# Patient Record
Sex: Male | Born: 1980 | Race: White | Hispanic: No | Marital: Single | Smoking: Never smoker
Health system: Southern US, Community
[De-identification: ages and names within clinical notes are randomized; demographics above are authoritative.]

## PROBLEM LIST (undated history)

## (undated) ENCOUNTER — Emergency Department: Admission: EM | Discharge status: Absent without leave | Payer: Self-pay

## (undated) DIAGNOSIS — M722 Plantar fascial fibromatosis: Secondary | ICD-10-CM

## (undated) DIAGNOSIS — K5792 Diverticulitis of intestine, part unspecified, without perforation or abscess without bleeding: Secondary | ICD-10-CM

## (undated) DIAGNOSIS — F909 Attention-deficit hyperactivity disorder, unspecified type: Secondary | ICD-10-CM

## (undated) DIAGNOSIS — G4730 Sleep apnea, unspecified: Secondary | ICD-10-CM

## (undated) HISTORY — DX: Diverticulitis of intestine, part unspecified, without perforation or abscess without bleeding: K57.92

## (undated) HISTORY — DX: Morbid (severe) obesity due to excess calories: E66.01

## (undated) HISTORY — DX: Attention-deficit hyperactivity disorder, unspecified type: F90.9

---
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
## 2017-11-28 DIAGNOSIS — G4733 Obstructive sleep apnea (adult) (pediatric): Secondary | ICD-10-CM | POA: Diagnosis not present

## 2017-12-26 DIAGNOSIS — G4733 Obstructive sleep apnea (adult) (pediatric): Secondary | ICD-10-CM | POA: Diagnosis not present

## 2018-01-17 DIAGNOSIS — F902 Attention-deficit hyperactivity disorder, combined type: Secondary | ICD-10-CM | POA: Diagnosis not present

## 2018-01-17 DIAGNOSIS — F419 Anxiety disorder, unspecified: Secondary | ICD-10-CM | POA: Diagnosis not present

## 2018-01-17 DIAGNOSIS — Z79899 Other long term (current) drug therapy: Secondary | ICD-10-CM | POA: Diagnosis not present

## 2018-01-17 DIAGNOSIS — G4733 Obstructive sleep apnea (adult) (pediatric): Secondary | ICD-10-CM | POA: Diagnosis not present

## 2018-01-24 ENCOUNTER — Emergency Department
Admission: EM | Admit: 2018-01-24 | Discharge: 2018-01-24 | Disposition: A | Discharge status: Home / Self Care | Payer: BLUE CROSS/BLUE SHIELD | Source: Home / Self Care

## 2018-01-24 ENCOUNTER — Emergency Department (INDEPENDENT_AMBULATORY_CARE_PROVIDER_SITE_OTHER): Payer: BLUE CROSS/BLUE SHIELD

## 2018-01-24 ENCOUNTER — Other Ambulatory Visit: Payer: Self-pay

## 2018-01-24 DIAGNOSIS — M19071 Primary osteoarthritis, right ankle and foot: Secondary | ICD-10-CM

## 2018-01-24 DIAGNOSIS — K760 Fatty (change of) liver, not elsewhere classified: Secondary | ICD-10-CM

## 2018-01-24 DIAGNOSIS — M19072 Primary osteoarthritis, left ankle and foot: Secondary | ICD-10-CM

## 2018-01-24 DIAGNOSIS — R310 Gross hematuria: Secondary | ICD-10-CM | POA: Diagnosis not present

## 2018-01-24 DIAGNOSIS — K5732 Diverticulitis of large intestine without perforation or abscess without bleeding: Secondary | ICD-10-CM

## 2018-01-24 DIAGNOSIS — M79671 Pain in right foot: Secondary | ICD-10-CM | POA: Diagnosis not present

## 2018-01-24 DIAGNOSIS — M79672 Pain in left foot: Secondary | ICD-10-CM

## 2018-01-24 HISTORY — DX: Sleep apnea, unspecified: G47.30

## 2018-01-24 HISTORY — DX: Plantar fascial fibromatosis: M72.2

## 2018-01-24 LAB — POCT URINALYSIS DIP (MANUAL ENTRY)
Glucose, UA: NEGATIVE mg/dL
Leukocytes, UA: NEGATIVE
Nitrite, UA: NEGATIVE
SPEC GRAV UA: 1.025 (ref 1.010–1.025)
UROBILINOGEN UA: 1 U/dL
pH, UA: 6 (ref 5.0–8.0)

## 2018-01-24 LAB — POCT FASTING CBG KUC MANUAL ENTRY: POCT GLUCOSE (MANUAL ENTRY) KUC: 123 mg/dL — AB (ref 70–99)

## 2018-01-24 MED ORDER — METRONIDAZOLE 500 MG PO TABS: 500.0000 mg | ORAL_TABLET | Freq: Two times a day (BID) | ORAL | 0 refills | Status: DC

## 2018-01-24 MED ORDER — DICLOFENAC SODIUM 1 % TD GEL: 4.0000 g | Freq: Four times a day (QID) | TRANSDERMAL | 2 refills | Status: AC

## 2018-01-24 MED ORDER — CIPROFLOXACIN HCL 500 MG PO TABS: 500.0000 mg | ORAL_TABLET | Freq: Two times a day (BID) | ORAL | 0 refills | Status: DC

## 2018-01-24 NOTE — ED Triage Notes (Signed)
Pt has had foot pain since last June when he was dx with plantar fasciitis.  Last Sunday had lower abdominal pain on the left lower side.  Had sharp pain when going to the [facility]. This pain has mostly resolved.  Left big toe pain yesterday morning, then right foot started hurting later in the day.

## 2018-01-24 NOTE — ED Provider Notes (Signed)
[facility]    CSN: [id number] Arrival date & time: 01/24/18  0944     History   Chief Complaint Chief Complaint  Patient presents with  . Foot Pain    HPI Fernando Martinez is a 37 y.o. male.   Pt complains of pain in his left lower abdomen.  Pt reported some discomfort in his back.  Pt complains of soreness in left lower abdomen.  Pt complains of an aching pain.  Pt reports not as bad today as yesterday.  Pt has had some increased urination,  Pt has a family history of diabetes.  Pt denies fever or chills,  No black or tarry stools.    The history is provided by the patient.  Foot Pain  This is a chronic problem. Episode onset: months. Associated symptoms include abdominal pain. The symptoms are aggravated by walking. Nothing relieves the symptoms. He has tried nothing for the symptoms. The treatment provided no relief.   Pt has been diagnosed with plantar fascitis.  Pt complains of pain in hie right lateral foot.  Pt has recnely had pain in his right toe Past Medical History:  Diagnosis Date  . Plantar fasciitis   . Sleep apnea     There are no active problems to display for this patient.   History reviewed. No pertinent surgical history.     Home Medications    Prior to Admission medications   Medication Sig Start Date End Date Taking? Authorizing Provider  ciprofloxacin (CIPRO) 500 MG tablet Take 1 tablet (500 mg total) by mouth 2 (two) times daily. 01/24/18   Elson AreasSofia, Joren Rehm K, PA-C  diclofenac sodium (VOLTAREN) 1 % GEL Apply 4 g topically 4 (four) times daily. 01/24/18   Elson AreasSofia, Ambrose Wile K, PA-C  metroNIDAZOLE (FLAGYL) 500 MG tablet Take 1 tablet (500 mg total) by mouth 2 (two) times daily. 01/24/18   Elson AreasSofia, Kristle Wesch K, PA-C  VYVANSE 30 MG capsule Take by mouth daily. 01/17/18   [provider]  VYVANSE 40 MG capsule Take by mouth daily. 01/17/18   [provider]    Family History Family History  Problem Relation Age of Onset  . Diabetes  Mother   . ADD / ADHD Mother   . Thyroid disease Father   . ADD / ADHD Sister     Social History Social History   Tobacco Use  . Smoking status: Never Smoker  . Smokeless tobacco: Never Used  Substance Use Topics  . Alcohol use: Yes  . Drug use: Not Currently     Allergies   Patient has no known allergies.   Review of Systems Review of Systems  Gastrointestinal: Positive for abdominal pain. Negative for constipation, diarrhea, nausea and vomiting.  Genitourinary: Positive for frequency.  Musculoskeletal: Positive for arthralgias and gait problem.  All other systems reviewed and are negative.    Physical Exam Triage Vital Signs ED Triage Vitals  Enc Vitals Group     BP 01/24/18 1019 136/85     Pulse Rate 01/24/18 1019 79     Resp --      Temp 01/24/18 1019 (!) 97.5 F (36.4 C)     Temp Source 01/24/18 1019 Oral     SpO2 01/24/18 1019 98 %     Weight 01/24/18 1020 (!) 330 lb (149.7 kg)     Height 01/24/18 1020 5' 7.5" (1.715 m)     Head Circumference --      Peak Flow --  Pain Score 01/24/18 1020 7     Pain Loc --      Pain Edu? --      Excl. in GC? --    No data found.  Updated Vital Signs BP 136/85 (BP Location: Right Arm)   Pulse 79   Temp (!) 97.5 F (36.4 C) (Oral)   Ht 5' 7.5" (1.715 m)   Wt (!) 330 lb (149.7 kg)   SpO2 98%   BMI 50.92 kg/m   Visual Acuity Right Eye Distance:   Left Eye Distance:   Bilateral Distance:    Right Eye Near:   Left Eye Near:    Bilateral Near:     Physical Exam  Constitutional: He appears well-developed and well-nourished.  HENT:  Head: Normocephalic and atraumatic.  Mouth/Throat: Oropharynx is clear and moist.  Eyes: Conjunctivae are normal.  Neck: Neck supple.  Cardiovascular: Normal rate and regular rhythm.  No murmur heard. Pulmonary/Chest: Effort normal and breath sounds normal. No respiratory distress.  Abdominal: Soft. There is tenderness.  Tender left lower abdomen   Musculoskeletal: He  exhibits tenderness. He exhibits no edema or deformity.  Tender right lateral foot.  Neurological: He is alert.  Skin: Skin is warm and dry.  Psychiatric: He has a normal mood and affect.  Nursing note and vitals reviewed.    UC Treatments / Results  Labs (all labs ordered are listed, but only abnormal results are displayed) Labs Reviewed  POCT FASTING CBG [facility] MANUAL ENTRY - Abnormal; Notable for the following components:      Result Value   POCT Glucose ([facility]) 123 (*)    All other components within normal limits  POCT URINALYSIS DIP (MANUAL ENTRY) - Abnormal; Notable for the following components:   Bilirubin, UA small (*)    Ketones, POC UA trace (5) (*)    Blood, UA moderate (*)    Protein Ur, POC =30 (*)    All other components within normal limits    EKG None Radiology Dg Foot Complete Left  Result Date: 01/24/2018 CLINICAL DATA:  Chronic bilateral foot pain.  No known injury. EXAM: LEFT FOOT - COMPLETE 3+ VIEW; RIGHT FOOT COMPLETE - 3+ VIEW COMPARISON:  None. FINDINGS: Left foot: No acute fracture or dislocation. Mild osteoarthritis of the first MTP joint. Remaining joint spaces are preserved. Bone mineralization is normal. Tiny plantar and Achilles enthesophytes. Soft tissues are unremarkable. Right foot: No acute fracture or dislocation. Mild osteoarthritis of the first MTP joint. Remaining joint spaces are preserved. Bone mineralization is normal. Soft tissues are unremarkable. IMPRESSION: 1.  No acute osseous abnormality. 2. Bilateral mild first MTP joint osteoarthritis. Electronically Signed   By: Obie Dredge M.D.   On: 01/24/2018 11:07   Dg Foot Complete Right  Result Date: 01/24/2018 CLINICAL DATA:  Chronic bilateral foot pain.  No known injury. EXAM: LEFT FOOT - COMPLETE 3+ VIEW; RIGHT FOOT COMPLETE - 3+ VIEW COMPARISON:  None. FINDINGS: Left foot: No acute fracture or dislocation. Mild osteoarthritis of the first MTP joint. Remaining joint spaces are preserved. Bone  mineralization is normal. Tiny plantar and Achilles enthesophytes. Soft tissues are unremarkable. Right foot: No acute fracture or dislocation. Mild osteoarthritis of the first MTP joint. Remaining joint spaces are preserved. Bone mineralization is normal. Soft tissues are unremarkable. IMPRESSION: 1.  No acute osseous abnormality. 2. Bilateral mild first MTP joint osteoarthritis. Electronically Signed   By: Obie Dredge M.D.   On: 01/24/2018 11:07   Ct Renal Stone Study  Result Date: 01/24/2018 CLINICAL DATA:  Acute left lower quadrant abdominal pain, gross hematuria. EXAM: CT ABDOMEN AND PELVIS WITHOUT CONTRAST TECHNIQUE: Multidetector CT imaging of the abdomen and pelvis was performed following the standard protocol without IV contrast. COMPARISON:  None. FINDINGS: Lower chest: No acute abnormality. Hepatobiliary: No gallstones are noted. Fatty infiltration of the liver is noted. No biliary dilatation is noted. Pancreas: Unremarkable. No pancreatic ductal dilatation or surrounding inflammatory changes. Spleen: Normal in size without focal abnormality. Adrenals/Urinary Tract: Adrenal glands are unremarkable. Kidneys are normal, without renal calculi, focal lesion, or hydronephrosis. Bladder is unremarkable. Stomach/Bowel: The stomach appears normal. The appendix appears normal. There is no evidence of bowel obstruction. Focal diverticulitis is noted at the junction of the descending and sigmoid colon. No abscess is noted. Vascular/Lymphatic: No significant vascular findings are present. No enlarged abdominal or pelvic lymph nodes. Reproductive: Prostate is unremarkable. Other: No abdominal wall hernia or abnormality. No abdominopelvic ascites. Musculoskeletal: No acute or significant osseous findings. IMPRESSION: Focal diverticulitis noted at the junction of the descending and sigmoid colon. No abscess or evidence of perforation is noted. Fatty infiltration of the liver. Electronically Signed   By: Lupita Raider, M.D.   On: 01/24/2018 11:57    Procedures Procedures (including critical care time)  Medications Ordered in UC Medications - No data to display   Initial Impression / Assessment and Plan / UC Course  I have reviewed the triage vital signs and the nursing notes.  Pertinent labs & imaging results that were available during my care of the patient were reviewed by me and considered in my medical decision making (see chart for details).     Ua shows blood and protein.  No nitrate or leukocytes.  Pt advised his MD will need to repeat ua for blood.  Ct scan shows no evidence of stone.  Pt has elevated glucose of 123.  (this is fasting) Cmet pending.  Pt advised discuss with his primary.   Xrays of feet show arthritis.  Pt advised weight loss, voltaren gel, good shoes,  Schedule to see Dr. Denyse Amass for Orthotics  Ct scan of pt abdomen is reviewed and discussed with him.  He has diverticulitis.  Pt given rx for cipro and flagyl.  He is advised to see primary for recheck and Gi referral for follow up.   Final Clinical Impressions(s) / UC Diagnoses   Final diagnoses:  Foot pain, right  Foot pain, left  Diverticulitis of colon    ED Discharge Orders        Ordered    metroNIDAZOLE (FLAGYL) 500 MG tablet  2 times daily     01/24/18 1223    ciprofloxacin (CIPRO) 500 MG tablet  2 times daily     01/24/18 1223    diclofenac sodium (VOLTAREN) 1 % GEL  4 times daily     01/24/18 1223       Controlled Substance Prescriptions [place] Controlled Substance Registry consulted? Not Applicable   An After Visit Summary was printed and given to the patient.   Elson Areas, [place] 01/24/18 1341

## 2018-01-24 NOTE — Discharge Instructions (Signed)
See your Physician for recheck abdominal pain.

## 2018-01-25 ENCOUNTER — Encounter: Payer: Self-pay | Admitting: Family Medicine

## 2018-01-25 ENCOUNTER — Ambulatory Visit: Payer: BLUE CROSS/BLUE SHIELD | Admitting: Family Medicine

## 2018-01-25 VITALS — BP 124/84 | HR 98 | Ht 67.52 in | Wt 326.0 lb

## 2018-01-25 DIAGNOSIS — M10071 Idiopathic gout, right ankle and foot: Secondary | ICD-10-CM

## 2018-01-25 DIAGNOSIS — F909 Attention-deficit hyperactivity disorder, unspecified type: Secondary | ICD-10-CM

## 2018-01-25 DIAGNOSIS — M109 Gout, unspecified: Secondary | ICD-10-CM

## 2018-01-25 DIAGNOSIS — M2021 Hallux rigidus, right foot: Secondary | ICD-10-CM

## 2018-01-25 DIAGNOSIS — M2022 Hallux rigidus, left foot: Secondary | ICD-10-CM

## 2018-01-25 DIAGNOSIS — M722 Plantar fascial fibromatosis: Secondary | ICD-10-CM

## 2018-01-25 DIAGNOSIS — K5792 Diverticulitis of intestine, part unspecified, without perforation or abscess without bleeding: Secondary | ICD-10-CM | POA: Diagnosis not present

## 2018-01-25 HISTORY — DX: Morbid (severe) obesity due to excess calories: E66.01

## 2018-01-25 HISTORY — DX: Diverticulitis of intestine, part unspecified, without perforation or abscess without bleeding: K57.92

## 2018-01-25 HISTORY — DX: Attention-deficit hyperactivity disorder, unspecified type: F90.9

## 2018-01-25 LAB — CBC
HEMATOCRIT: 41.9 % (ref 38.5–50.0)
Hemoglobin: 14.1 g/dL (ref 13.2–17.1)
MCH: 27.4 pg (ref 27.0–33.0)
MCHC: 33.7 g/dL (ref 32.0–36.0)
MCV: 81.4 fL (ref 80.0–100.0)
MPV: 9.3 fL (ref 7.5–12.5)
PLATELETS: 389 10*3/uL (ref 140–400)
RBC: 5.15 10*6/uL (ref 4.20–5.80)
RDW: 12.5 % (ref 11.0–15.0)
WBC: 15.6 10*3/uL — AB (ref 3.8–10.8)

## 2018-01-25 MED ORDER — HYDROCODONE-ACETAMINOPHEN 5-325 MG PO TABS: 1.0000 | ORAL_TABLET | Freq: Four times a day (QID) | ORAL | 0 refills | Status: DC | PRN

## 2018-01-25 MED ORDER — COLCHICINE 0.6 MG PO TABS: 0.6000 mg | ORAL_TABLET | Freq: Every day | ORAL | 3 refills | Status: AC

## 2018-01-25 NOTE — Patient Instructions (Signed)
Thank you for coming in today. Use the cam walker boot as needed.  Do not drive with the boot on your right foot. It is ok to take it off for driving.  Take colchicine 1 pill 2x daily today and 1 pill daily for 1 week. We will extend the colchicine if you uric acid levels are high and we are using Uloric or Allopurinol to lower the levels.  Use norco sparingly for severe pain.  Do not drive with this medicine.  Get blood work today.  I will get results to you likely tomorrow.  Recheck with me in 2 weeks.  Return or call sooner if needed.    Gout Gout is painful swelling that can occur in some of your joints. Gout is a type of arthritis. This condition is caused by having too much uric acid in your body. Uric acid is a chemical that forms when your body breaks down substances called purines. Purines are important for building body proteins. When your body has too much uric acid, sharp crystals can form and build up inside your joints. This causes pain and swelling. Gout attacks can happen quickly and be very painful (acute gout). Over time, the attacks can affect more joints and become more frequent (chronic gout). Gout can also cause uric acid to build up under your skin and inside your kidneys. What are the causes? This condition is caused by too much uric acid in your blood. This can occur because:  Your kidneys do not remove enough uric acid from your blood. This is the most common cause.  Your body makes too much uric acid. This can occur with some cancers and cancer treatments. It can also occur if your body is breaking down too many red blood cells (hemolytic anemia).  You eat too many foods that are high in purines. These foods include organ meats and some seafood. Alcohol, especially beer, is also high in purines.  A gout attack may be triggered by trauma or stress. What increases the risk? This condition is more likely to develop in people who:  Have a family history of  gout.  Are male and middle-aged.  Are male and have gone through menopause.  Are obese.  Frequently drink alcohol, especially beer.  Are dehydrated.  Lose weight too quickly.  Have an organ transplant.  Have lead poisoning.  Take certain medicines, including aspirin, cyclosporine, diuretics, levodopa, and niacin.  Have kidney disease or psoriasis.  What are the signs or symptoms? An attack of acute gout happens quickly. It usually occurs in just one joint. The most common place is the big toe. Attacks often start at night. Other joints that may be affected include joints of the feet, ankle, knee, fingers, wrist, or elbow. Symptoms may include:  Severe pain.  Warmth.  Swelling.  Stiffness.  Tenderness. The affected joint may be very painful to touch.  Shiny, red, or purple skin.  Chills and fever.  Chronic gout may cause symptoms more frequently. More joints may be involved. You may also have white or yellow lumps (tophi) on your hands or feet or in other areas near your joints. How is this diagnosed? This condition is diagnosed based on your symptoms, medical history, and physical exam. You may have tests, such as:  Blood tests to measure uric acid levels.  Removal of joint fluid with a needle (aspiration) to look for uric acid crystals.  X-rays to look for joint damage.  How is this treated? Treatment for  this condition has two phases: treating an acute attack and preventing future attacks. Acute gout treatment may include medicines to reduce pain and swelling, including:  NSAIDs.  Steroids. These are strong anti-inflammatory medicines that can be taken by mouth (orally) or injected into a joint.  Colchicine. This medicine relieves pain and swelling when it is taken soon after an attack. It can be given orally or through an IV tube.  Preventive treatment may include:  Daily use of smaller doses of NSAIDs or colchicine.  Use of a medicine that reduces  uric acid levels in your blood.  Changes to your diet. You may need to see a specialist about healthy eating (dietitian).  Follow these instructions at home: During a Gout Attack  If directed, apply ice to the affected area: ? Put ice in a plastic bag. ? Place a towel between your skin and the bag. ? Leave the ice on for 20 minutes, 2-3 times a day.  Rest the joint as much as possible. If the affected joint is in your leg, you may be given crutches to use.  Raise (elevate) the affected joint above the level of your heart as often as possible.  Drink enough fluids to keep your urine clear or pale yellow.  Take over-the-counter and prescription medicines only as told by your health care provider.  Do not drive or operate heavy machinery while taking prescription pain medicine.  Follow instructions from your health care provider about eating or drinking restrictions.  Return to your normal activities as told by your health care provider. Ask your health care provider what activities are safe for you. Avoiding Future Gout Attacks  Follow a low-purine diet as told by your dietitian or health care provider. Avoid foods and drinks that are high in purines, including liver, kidney, anchovies, asparagus, herring, mushrooms, mussels, and beer.  Limit alcohol intake to no more than 1 drink a day for nonpregnant women and 2 drinks a day for men. One drink equals 12 oz of beer, 5 oz of wine, or 1 oz of hard liquor.  Maintain a healthy weight or lose weight if you are overweight. If you want to lose weight, talk with your health care provider. It is important that you do not lose weight too quickly.  Start or maintain an exercise program as told by your health care provider.  Drink enough fluids to keep your urine clear or pale yellow.  Take over-the-counter and prescription medicines only as told by your health care provider.  Keep all follow-up visits as told by your health care provider.  This is important. Contact a health care provider if:  You have another gout attack.  You continue to have symptoms of a gout attack after10 days of treatment.  You have side effects from your medicines.  You have chills or a fever.  You have burning pain when you urinate.  You have pain in your lower back or belly. Get help right away if:  You have severe or uncontrolled pain.  You cannot urinate. This information is not intended to replace advice given to you by your health care provider. Make sure you discuss any questions you have with your health care provider. Document Released: 09/17/2000 Document Revised: 02/26/2016 Document Reviewed: 07/03/2015 [employer] Interactive Patient Education  [employer]2018 [employer]

## 2018-01-25 NOTE — Progress Notes (Signed)
Subjective:    I'm seeing this patient as a consultation for:  Langston Masker PA-C  CC: Right Foot Pain  HPI: Fernando Martinez was seen yesterday in urgent care for worsening right foot pain.  He has had pains off and on in both feet for years but notes over the last few days the pain is been worsening dramatically in his right foot.  He notes pain in his plantar calcaneus and across his dorsal midfoot but most predominantly around his right first MTP.  He notes pain and swelling.  He denies any injury.  He has never had an evaluation for gout nor been treated for gout.  In urgent care he was also having what was thought to be a diverticulitis flare which she is currently receiving Cipro and Flagyl.  Therefore NSAIDs or steroids were not used for foot pain.  He notes the pain is quite severe and he has difficulty walking.  He denies any radiating pain weakness or numbness fevers or chills.  He does have some pain in the left foot across the plantar calcaneus but notes the right is much worse than the left.  Past medical history, Surgical history, Family history not pertinant except as noted below, Social history, Allergies, and medications have been entered into the medical record, reviewed, and no changes needed.   Review of Systems: No headache, visual changes, nausea, vomiting, diarrhea, constipation, dizziness, abdominal pain, skin rash, fevers, chills, night sweats, weight loss, swollen lymph nodes, body aches, joint swelling, muscle aches, chest pain, shortness of breath, mood changes, visual or auditory hallucinations.   Objective:    Vitals:   01/25/18 0733  BP: 124/84  Pulse: 98  SpO2: 97%   General: Well Developed, well nourished, and in no acute distress.  Neuro/Psych: Alert and oriented x3, extra-ocular muscles intact, able to move all 4 extremities, sensation grossly intact. Skin: Warm and dry, no rashes noted.  Respiratory: Not using accessory muscles, speaking in full sentences, trachea  midline.  Cardiovascular: Pulses palpable, no extremity edema. Abdomen: Does not appear distended. MSK:  Right foot swollen and mild erythema at first MTP.  Otherwise normal-appearing with no induration or ecchymosis. Mildly tender to palpation plantar calcaneus Mildly tender to palpation across the plantar dorsal midfoot Significantly tender to palpation at the first MTP with decreased motion. Pulses capillary refill and sensation are intact  Left foot: Largely normal-appearing Tender palpation plantar calcaneus. Nontender otherwise. Decreased motion left first MTP   Results for orders placed or performed during the hospital encounter of 01/24/18 (from the past 24 hour(s))  POCT Finger stick Glucose     Status: Abnormal   Collection Time: 01/24/18 10:36 AM  Result Value Ref Range   POCT Glucose ([facility]) 123 (A) 70 - 99 mg/dL  POCT urinalysis dipstick (new)     Status: Abnormal   Collection Time: 01/24/18 10:44 AM  Result Value Ref Range   Color, UA yellow yellow   Clarity, UA clear clear   Glucose, UA negative negative mg/dL   Bilirubin, UA small (A) negative   Ketones, POC UA trace (5) (A) negative mg/dL   Spec Grav, UA 1.610 9.604 - 1.025   Blood, UA moderate (A) negative   pH, UA 6.0 5.0 - 8.0   Protein Ur, POC =30 (A) negative mg/dL   Urobilinogen, UA 1.0 0.2 or 1.0 E.U./dL   Nitrite, UA Negative Negative   Leukocytes, UA Negative Negative   Dg Foot Complete Left  Result Date: 01/24/2018 CLINICAL DATA:  Chronic bilateral foot pain.  No known injury. EXAM: LEFT FOOT - COMPLETE 3+ VIEW; RIGHT FOOT COMPLETE - 3+ VIEW COMPARISON:  None. FINDINGS: Left foot: No acute fracture or dislocation. Mild osteoarthritis of the first MTP joint. Remaining joint spaces are preserved. Bone mineralization is normal. Tiny plantar and Achilles enthesophytes. Soft tissues are unremarkable. Right foot: No acute fracture or dislocation. Mild osteoarthritis of the first MTP joint. Remaining joint  spaces are preserved. Bone mineralization is normal. Soft tissues are unremarkable. IMPRESSION: 1.  No acute osseous abnormality. 2. Bilateral mild first MTP joint osteoarthritis. Electronically Signed   By: Obie Dredge M.D.   On: 01/24/2018 11:07   Dg Foot Complete Right  Result Date: 01/24/2018 CLINICAL DATA:  Chronic bilateral foot pain.  No known injury. EXAM: LEFT FOOT - COMPLETE 3+ VIEW; RIGHT FOOT COMPLETE - 3+ VIEW COMPARISON:  None. FINDINGS: Left foot: No acute fracture or dislocation. Mild osteoarthritis of the first MTP joint. Remaining joint spaces are preserved. Bone mineralization is normal. Tiny plantar and Achilles enthesophytes. Soft tissues are unremarkable. Right foot: No acute fracture or dislocation. Mild osteoarthritis of the first MTP joint. Remaining joint spaces are preserved. Bone mineralization is normal. Soft tissues are unremarkable. IMPRESSION: 1.  No acute osseous abnormality. 2. Bilateral mild first MTP joint osteoarthritis. Electronically Signed   By: Obie Dredge M.D.   On: 01/24/2018 11:07   Ct Renal Stone Study  Result Date: 01/24/2018 CLINICAL DATA:  Acute left lower quadrant abdominal pain, gross hematuria. EXAM: CT ABDOMEN AND PELVIS WITHOUT CONTRAST TECHNIQUE: Multidetector CT imaging of the abdomen and pelvis was performed following the standard protocol without IV contrast. COMPARISON:  None. FINDINGS: Lower chest: No acute abnormality. Hepatobiliary: No gallstones are noted. Fatty infiltration of the liver is noted. No biliary dilatation is noted. Pancreas: Unremarkable. No pancreatic ductal dilatation or surrounding inflammatory changes. Spleen: Normal in size without focal abnormality. Adrenals/Urinary Tract: Adrenal glands are unremarkable. Kidneys are normal, without renal calculi, focal lesion, or hydronephrosis. Bladder is unremarkable. Stomach/Bowel: The stomach appears normal. The appendix appears normal. There is no evidence of bowel obstruction.  Focal diverticulitis is noted at the junction of the descending and sigmoid colon. No abscess is noted. Vascular/Lymphatic: No significant vascular findings are present. No enlarged abdominal or pelvic lymph nodes. Reproductive: Prostate is unremarkable. Other: No abdominal wall hernia or abnormality. No abdominopelvic ascites. Musculoskeletal: No acute or significant osseous findings. IMPRESSION: Focal diverticulitis noted at the junction of the descending and sigmoid colon. No abscess or evidence of perforation is noted. Fatty infiltration of the liver. Electronically Signed   By: Lupita Raider, M.D.   On: 01/24/2018 11:57  X-ray images of the feet were reviewed independently  Impression and Recommendations:    Assessment and Plan: 37 y.o. male with  Right foot pain: Multifactorial likely some component of plantar fasciitis and first MTP DJD.  However very likely the majority of the pain is due to gout.  Plan for empiric treatment with Cam walker boot and crutches for comfort, and colchicine.  Will check CBC metabolic panel and uric acid.  Likely will start Uloric or allopurinol for uric acid lowering tomorrow when results are back.  Norco for severe pain as needed.  Recheck in 2 weeks.  Ultimate plan is to get presumed gout stabilized and transition care back to PCP.  Will continue treatment for plantar fasciitis once gout flare is under control.  Follow-up with PCP regarding diverticulitis. Additionally recommend follow-up with PCP to address  morbid obesity.  BMI 50 today.  Orders Placed This Encounter  Procedures  . CBC  . COMPLETE METABOLIC PANEL WITH GFR  . Uric acid   Meds ordered this encounter  Medications  . colchicine 0.6 MG tablet    Sig: Take 1 tablet (0.6 mg total) by mouth daily.    Dispense:  30 tablet    Refill:  3  . HYDROcodone-acetaminophen (NORCO/VICODIN) 5-325 MG tablet    Sig: Take 1 tablet by mouth every 6 (six) hours as needed.    Dispense:  10 tablet    Refill:   0    Discussed warning signs or symptoms. Please see discharge instructions. Patient expresses understanding.   CC: Farris HasMorrow, Aaron, MD

## 2018-01-26 DIAGNOSIS — G4733 Obstructive sleep apnea (adult) (pediatric): Secondary | ICD-10-CM | POA: Diagnosis not present

## 2018-01-26 DIAGNOSIS — M109 Gout, unspecified: Secondary | ICD-10-CM | POA: Insufficient documentation

## 2018-01-26 LAB — COMPLETE METABOLIC PANEL WITH GFR
AG Ratio: 1.4 (calc) (ref 1.0–2.5)
ALT: 33 U/L (ref 9–46)
AST: 25 U/L (ref 10–40)
Albumin: 4.2 g/dL (ref 3.6–5.1)
Alkaline phosphatase (APISO): 75 U/L (ref 40–115)
BILIRUBIN TOTAL: 1 mg/dL (ref 0.2–1.2)
BUN: 10 mg/dL (ref 7–25)
CO2: 24 mmol/L (ref 20–32)
Calcium: 9.3 mg/dL (ref 8.6–10.3)
Chloride: 98 mmol/L (ref 98–110)
Creat: 0.98 mg/dL (ref 0.60–1.35)
GFR, Est African American: 115 mL/min/{1.73_m2} (ref >=60)
GFR, Est Non African American: 99 mL/min/{1.73_m2} (ref >=60)
GLUCOSE: 106 mg/dL — AB (ref 65–99)
Globulin: 3 g/dL (calc) (ref 1.9–3.7)
POTASSIUM: 4 mmol/L (ref 3.5–5.3)
Sodium: 135 mmol/L (ref 135–146)
TOTAL PROTEIN: 7.2 g/dL (ref 6.1–8.1)

## 2018-01-26 LAB — URIC ACID: Uric Acid, Serum: 8.9 mg/dL — ABNORMAL HIGH (ref 4.0–8.0)

## 2018-01-26 MED ORDER — FEBUXOSTAT 80 MG PO TABS: 1.0000 | ORAL_TABLET | Freq: Every day | ORAL | 1 refills | Status: AC

## 2018-01-26 NOTE — Addendum Note (Signed)
Addended by: Rodolph BongOREY, Waverley Krempasky S on: 01/26/2018 07:38 AM   Modules accepted: Orders

## 2018-01-27 ENCOUNTER — Telehealth: Payer: Self-pay | Admitting: Emergency Medicine

## 2018-01-27 NOTE — Telephone Encounter (Signed)
Dr. Denyse Amassorey present to clarify how to take meds for current gout flare; he will be following up in office soon; already reports some relief of pain.

## 2018-02-06 DIAGNOSIS — R319 Hematuria, unspecified: Secondary | ICD-10-CM | POA: Diagnosis not present

## 2018-02-06 DIAGNOSIS — R7309 Other abnormal glucose: Secondary | ICD-10-CM | POA: Diagnosis not present

## 2018-02-06 DIAGNOSIS — K5792 Diverticulitis of intestine, part unspecified, without perforation or abscess without bleeding: Secondary | ICD-10-CM | POA: Diagnosis not present

## 2018-02-09 ENCOUNTER — Ambulatory Visit: Payer: BLUE CROSS/BLUE SHIELD | Admitting: Family Medicine

## 2018-02-09 ENCOUNTER — Encounter: Payer: Self-pay | Admitting: Family Medicine

## 2018-02-09 VITALS — BP 169/83 | HR 90 | Ht 67.0 in | Wt 317.0 lb

## 2018-02-09 DIAGNOSIS — M79671 Pain in right foot: Secondary | ICD-10-CM | POA: Insufficient documentation

## 2018-02-09 DIAGNOSIS — M2021 Hallux rigidus, right foot: Secondary | ICD-10-CM | POA: Diagnosis not present

## 2018-02-09 DIAGNOSIS — M2022 Hallux rigidus, left foot: Secondary | ICD-10-CM

## 2018-02-09 DIAGNOSIS — M10071 Idiopathic gout, right ankle and foot: Secondary | ICD-10-CM

## 2018-02-09 DIAGNOSIS — R030 Elevated blood-pressure reading, without diagnosis of hypertension: Secondary | ICD-10-CM | POA: Diagnosis not present

## 2018-02-09 NOTE — Patient Instructions (Addendum)
Continue colchicine daily.  Continue Uloric daily.  After 1-3 months stop colchicine. If pain returns resume colchicine.  You should have uric acid level recheck in 3-6 months.  Weight loss will help with gout.   Recheck with me as needed.  Ultimately your primary doctor can take over gout prescribing.   If foot pain do not resolve recheck in a few weeks. Next step is MRI and orthotics or injection.   Recheck as needed.

## 2018-02-09 NOTE — Progress Notes (Signed)
Fernando Martinez is a 37 y.o. male who presents to [facility] today for follow-up foot pain from gout.  Fernando Martinez was seen on April 24 for significant pain in his great toe.  X-rays did show some DJD however he was highly suspicious for gout.  He was treated empirically with colchicine and his uric acid level was checked and found to be elevated at 8.9.  He was asked to continue the colchicine and Uloric was prescribed as well.  He was given a Cam walker boot and Norco as needed.  In the interim his pain Martinez significantly improved.  He notes that he was able to wean out of the boot and stop Norco.  He forgot to take the colchicine for a few days and developed new right lateral foot pain.  He points to the lateral midfoot as his area of maximum pain.  He notes this is mild to moderately painful with walking.  He continues to use normal shoes.  He notes his activity level Martinez increased recently at work.  Data blood pressure: Patient notes that his blood pressure is typically been pretty well controlled.  He was seen by his primary care provider recently with a normal blood pressure measurement.   Past Medical History:  Diagnosis Date  . ADHD 01/25/2018  . Diverticulitis 01/25/2018  . Morbid obesity (HCC) 01/25/2018  . Plantar fasciitis   . Sleep apnea    No past surgical history on file. Social History   Tobacco Use  . Smoking status: Never Smoker  . Smokeless tobacco: Never Used  Substance Use Topics  . Alcohol use: Yes     ROS:  As above   Medications: Current Outpatient Medications  Medication Sig Dispense Refill  . colchicine 0.6 MG tablet Take 1 tablet (0.6 mg total) by mouth daily. 30 tablet 3  . diclofenac sodium (VOLTAREN) 1 % GEL Apply 4 g topically 4 (four) times daily. 1 Tube 2  . Febuxostat (ULORIC) 80 MG TABS Take 1 tablet (80 mg total) by mouth daily. 90 tablet 1  . VYVANSE 30 MG capsule Take by mouth daily.  0  . VYVANSE 40 MG  capsule Take by mouth daily.  0   No current facility-administered medications for this visit.    No Known Allergies   Exam:  BP (!) 169/83   Pulse 90   Ht  (1.702 m)   Wt (!) 317 lb (143.8 kg)   BMI 49.65 kg/m   General: Well Developed, well nourished, and in no acute distress.  Neuro/Psych: Alert and oriented x3, extra-ocular muscles intact, able to move all 4 extremities, sensation grossly intact. Skin: Warm and dry, no rashes noted.  Respiratory: Not using accessory muscles, speaking in full sentences, trachea midline.  Cardiovascular: Pulses palpable, no extremity edema. Abdomen: Does not appear distended. MSK:  MSK:  Right foot mild swelling across the dorsal midfoot no significant swelling or tenderness at first MTP.  Bunion formation present however.  Otherwise normal-appearing with no induration or ecchymosis. Tender to palpation at dorsal lateral midfoot near the cuboid bone. Ankle is nontender.  Foot and ankle motion are normal. Pulses capillary refill and sensation are intact  EXAM: LEFT FOOT - COMPLETE 3+ VIEW; RIGHT FOOT COMPLETE - 3+ VIEW  COMPARISON:  None.  FINDINGS: Left foot: No acute fracture or dislocation. Mild osteoarthritis of the first MTP joint. Remaining joint spaces are preserved. Bone mineralization is normal. Tiny plantar and Achilles enthesophytes. Soft tissues  are unremarkable.  Right foot: No acute fracture or dislocation. Mild osteoarthritis of the first MTP joint. Remaining joint spaces are preserved. Bone mineralization is normal. Soft tissues are unremarkable.  IMPRESSION: 1.  No acute osseous abnormality. 2. Bilateral mild first MTP joint osteoarthritis.   Electronically Signed   By: Obie Dredge M.D.   On: 01/24/2018 11:07 I personally (independently) visualized and performed the interpretation of the images attached in this note.  Lab Results  Component Value Date   LABURIC 8.9 (H) 01/25/2018     Chemistry       Component Value Date/Time   NA 135 01/25/2018 0857   K 4.0 01/25/2018 0857   CL 98 01/25/2018 0857   CO2 24 01/25/2018 0857   BUN 10 01/25/2018 0857   CREATININE 0.98 01/25/2018 0857      Component Value Date/Time   CALCIUM 9.3 01/25/2018 0857   AST 25 01/25/2018 0857   ALT 33 01/25/2018 0857   BILITOT 1.0 01/25/2018 0857     Lab Results  Component Value Date   WBC 15.6 (H) 01/25/2018   HGB 14.1 01/25/2018   HCT 41.9 01/25/2018   MCV 81.4 01/25/2018   PLT 389 01/25/2018     Assessment and Plan: 37 y.o. male with lateral midfoot pain.  Patient did not have much pain in this area on his initial presentation on April 24.  He was completely asymptomatic until recently.  His pain returned when he stopped taking his colchicine.  I suspect his knee pain is very likely related to gout.  Plan to resume colchicine daily.  Plan to continue Uloric as well.  Plan to continue colchicine prophylaxis for 1-3 months while getting initiated on Uloric.  His uric acid should be rechecked in about 3 to 6 months.  I am happy to do it or his primary care provider can certainly do it as well.  Ideally will be shifting gout management to PCP in the future.  As for today's foot pain I think it is gout as stated above.  We will try to restart colchicine and if pain does not improve will return to clinic for further evaluation.  Next step would likely be MRI of the foot to evaluate for stress injury or tendinitis or joint effusion.  Injection versus further immobilization versus custom orthotics certainly reasonable. Gout is chronic problem with exacerbation. Lateral foot pain is new problem with uncertain prognosis.   Elevated blood pressure: This seems to be an outlier.  Patient typically Martinez pretty good blood pressure control.  Follow-up with PCP in the future.   Recheck as needed.   Discussed warning signs or symptoms. Please see discharge instructions. Patient expresses understanding.  CC:  Fernando Has, MD [facility] [address] [place], [address] [place], [place] [address] Call: [phone]

## 2018-03-09 DIAGNOSIS — R194 Change in bowel habit: Secondary | ICD-10-CM | POA: Diagnosis not present

## 2018-03-09 DIAGNOSIS — K921 Melena: Secondary | ICD-10-CM | POA: Diagnosis not present

## 2018-03-09 DIAGNOSIS — K5792 Diverticulitis of intestine, part unspecified, without perforation or abscess without bleeding: Secondary | ICD-10-CM | POA: Diagnosis not present

## 2018-03-14 DIAGNOSIS — F902 Attention-deficit hyperactivity disorder, combined type: Secondary | ICD-10-CM | POA: Diagnosis not present

## 2018-03-14 DIAGNOSIS — Z79899 Other long term (current) drug therapy: Secondary | ICD-10-CM | POA: Diagnosis not present

## 2018-03-14 DIAGNOSIS — F419 Anxiety disorder, unspecified: Secondary | ICD-10-CM | POA: Diagnosis not present

## 2018-03-14 DIAGNOSIS — G4733 Obstructive sleep apnea (adult) (pediatric): Secondary | ICD-10-CM | POA: Diagnosis not present

## 2018-04-01 ENCOUNTER — Other Ambulatory Visit: Payer: Self-pay | Admitting: Family Medicine

## 2018-04-24 DIAGNOSIS — D126 Benign neoplasm of colon, unspecified: Secondary | ICD-10-CM | POA: Diagnosis not present

## 2018-04-24 DIAGNOSIS — R194 Change in bowel habit: Secondary | ICD-10-CM | POA: Diagnosis not present

## 2018-04-24 DIAGNOSIS — R933 Abnormal findings on diagnostic imaging of other parts of digestive tract: Secondary | ICD-10-CM | POA: Diagnosis not present

## 2018-04-24 DIAGNOSIS — K921 Melena: Secondary | ICD-10-CM | POA: Diagnosis not present

## 2018-04-24 DIAGNOSIS — K5730 Diverticulosis of large intestine without perforation or abscess without bleeding: Secondary | ICD-10-CM | POA: Diagnosis not present

## 2018-04-24 DIAGNOSIS — K648 Other hemorrhoids: Secondary | ICD-10-CM | POA: Diagnosis not present

## 2018-04-26 DIAGNOSIS — D126 Benign neoplasm of colon, unspecified: Secondary | ICD-10-CM | POA: Diagnosis not present

## 2018-06-07 DIAGNOSIS — F902 Attention-deficit hyperactivity disorder, combined type: Secondary | ICD-10-CM | POA: Diagnosis not present

## 2018-06-07 DIAGNOSIS — Z79899 Other long term (current) drug therapy: Secondary | ICD-10-CM | POA: Diagnosis not present

## 2018-07-19 DIAGNOSIS — G4733 Obstructive sleep apnea (adult) (pediatric): Secondary | ICD-10-CM | POA: Diagnosis not present

## 2018-09-08 DIAGNOSIS — F419 Anxiety disorder, unspecified: Secondary | ICD-10-CM | POA: Diagnosis not present

## 2018-09-08 DIAGNOSIS — G4733 Obstructive sleep apnea (adult) (pediatric): Secondary | ICD-10-CM | POA: Diagnosis not present

## 2018-09-08 DIAGNOSIS — F902 Attention-deficit hyperactivity disorder, combined type: Secondary | ICD-10-CM | POA: Diagnosis not present

## 2018-09-08 DIAGNOSIS — Z79899 Other long term (current) drug therapy: Secondary | ICD-10-CM | POA: Diagnosis not present

## 2018-09-21 DIAGNOSIS — G4733 Obstructive sleep apnea (adult) (pediatric): Secondary | ICD-10-CM | POA: Diagnosis not present

## 2018-10-14 ENCOUNTER — Other Ambulatory Visit: Payer: Self-pay

## 2018-10-14 ENCOUNTER — Telehealth: Payer: Self-pay | Admitting: Emergency Medicine

## 2018-10-14 ENCOUNTER — Emergency Department
Admission: EM | Admit: 2018-10-14 | Discharge: 2018-10-14 | Disposition: A | Discharge status: Home / Self Care | Payer: BLUE CROSS/BLUE SHIELD | Source: Home / Self Care | Attending: Family Medicine | Admitting: Family Medicine

## 2018-10-14 ENCOUNTER — Encounter: Payer: Self-pay | Admitting: Emergency Medicine

## 2018-10-14 DIAGNOSIS — R195 Other fecal abnormalities: Secondary | ICD-10-CM | POA: Diagnosis not present

## 2018-10-14 LAB — COMPLETE METABOLIC PANEL WITH GFR
AG Ratio: 1.7 (calc) (ref 1.0–2.5)
ALT: 48 U/L — ABNORMAL HIGH (ref 9–46)
AST: 25 U/L (ref 10–40)
Albumin: 4.3 g/dL (ref 3.6–5.1)
Alkaline phosphatase (APISO): 67 U/L (ref 40–115)
BUN: 15 mg/dL (ref 7–25)
CO2: 26 mmol/L (ref 20–32)
Calcium: 9.6 mg/dL (ref 8.6–10.3)
Chloride: 105 mmol/L (ref 98–110)
Creat: 1.02 mg/dL (ref 0.60–1.35)
GFR, Est African American: 110 mL/min/{1.73_m2} (ref >=60)
GFR, Est Non African American: 95 mL/min/{1.73_m2} (ref >=60)
Globulin: 2.6 g/dL (calc) (ref 1.9–3.7)
Glucose, Bld: 222 mg/dL — ABNORMAL HIGH (ref 65–99)
Potassium: 4.3 mmol/L (ref 3.5–5.3)
Sodium: 139 mmol/L (ref 135–146)
Total Bilirubin: 0.4 mg/dL (ref 0.2–1.2)
Total Protein: 6.9 g/dL (ref 6.1–8.1)

## 2018-10-14 MED ORDER — AZITHROMYCIN 250 MG PO TABS: 500.0000 mg | ORAL_TABLET | Freq: Every day | ORAL | 0 refills | Status: AC

## 2018-10-14 NOTE — ED Triage Notes (Signed)
Here with 1 week of intermit diarrhea, mucous, oily stool substance since coming back honeymoon at [place]. Was diagnosed/ treated for Diverticulitis 8 months ago. Denies vomiting, abdominal pain.

## 2018-10-14 NOTE — ED Provider Notes (Signed)
[facility]    CSN: [id number] Arrival date & time: 10/14/18  0932     History   Chief Complaint Chief Complaint  Patient presents with  . GI Problem    HPI Fernando Martinez is a 38 y.o. male.   HPI  Fernando Martinez is a 38 y.o. male presenting to UC with hx of diverticulitis c/o 1 week of intermittent diarrhea, mucous, and oily stools since returning from his honeymoon in the [place].  He was dx with diverticulitis 8 months ago. He has since had a colonoscopy and had several precancerous polyps removed but was advised he was okay to f/u in 5 years.  Pt denies pain likely he had 8 months ago.  He has had increased acid reflux, relief with Tums.  Denies fever, chills, sweats. No nausea or vomiting.   Past Medical History:  Diagnosis Date  . ADHD 01/25/2018  . Diverticulitis 01/25/2018  . Morbid obesity (HCC) 01/25/2018  . Plantar fasciitis   . Sleep apnea     Patient Active Problem List   Diagnosis Date Noted  . Pain of midfoot, right 02/09/2018  . Gout 01/26/2018  . Plantar fasciitis, bilateral 01/25/2018  . Hallux rigidus of both feet 01/25/2018  . Diverticulitis 01/25/2018  . ADHD 01/25/2018  . Morbid obesity (HCC) 01/25/2018    History reviewed. No pertinent surgical history.     Home Medications    Prior to Admission medications   Medication Sig Start Date End Date Taking? Authorizing Provider  azithromycin (ZITHROMAX) 250 MG tablet Take 2 tablets (500 mg total) by mouth daily for 3 days. Take with food 10/14/18 10/17/18  Lurene Shadow, PA-C  colchicine 0.6 MG tablet Take 1 tablet (0.6 mg total) by mouth daily. 01/25/18   Rodolph Bong, MD  diclofenac sodium (VOLTAREN) 1 % GEL Apply 4 g topically 4 (four) times daily. 01/24/18   Elson Areas, PA-C  Febuxostat (ULORIC) 80 MG TABS Take 1 tablet (80 mg total) by mouth daily. 01/26/18   Rodolph Bong, MD  VYVANSE 30 MG capsule Take by mouth daily. 01/17/18   [provider]  VYVANSE 40 MG  capsule Take by mouth daily. 01/17/18   [provider]    Family History Family History  Problem Relation Age of Onset  . Diabetes Mother   . ADD / ADHD Mother   . Thyroid disease Father   . ADD / ADHD Sister     Social History Social History   Tobacco Use  . Smoking status: Never Smoker  . Smokeless tobacco: Never Used  Substance Use Topics  . Alcohol use: Yes  . Drug use: Not Currently     Allergies   Patient has no known allergies.   Review of Systems Review of Systems  Constitutional: Negative for appetite change, chills, diaphoresis, fatigue, fever and unexpected weight change.  HENT: Negative for congestion, postnasal drip and sore throat.   Respiratory: Negative for cough and wheezing.   Gastrointestinal: Positive for blood in stool (chronic, small amount due to hemorrhoids) and diarrhea. Negative for abdominal distention, abdominal pain, constipation, nausea and vomiting.  Genitourinary: Negative for dysuria, frequency and hematuria.     Physical Exam Triage Vital Signs ED Triage Vitals [10/14/18 1002]  Enc Vitals Group     BP 123/77     Pulse Rate 93     Resp      Temp 97.9 F (36.6 C)     Temp Source Oral  SpO2 96 %     Weight (!) 348 lb (157.9 kg)     Height 5\' 7"  (1.702 m)     Head Circumference      Peak Flow      Pain Score 3     Pain Loc      Pain Edu?      Excl. in GC?    No data found.  Updated Vital Signs BP 123/77 (BP Location: Left Arm)   Pulse 93   Temp 97.9 F (36.6 C) (Oral)   Ht 5\' 7"  (1.702 m)   Wt (!) 348 lb (157.9 kg)   SpO2 96%   BMI 54.50 kg/m   Visual Acuity Right Eye Distance:   Left Eye Distance:   Bilateral Distance:    Right Eye Near:   Left Eye Near:    Bilateral Near:     Physical Exam Vitals signs and nursing note reviewed.  Constitutional:      Appearance: Normal appearance. He is well-developed.  HENT:     Head: Normocephalic and atraumatic.  Neck:     Musculoskeletal: Normal  range of motion.  Cardiovascular:     Rate and Rhythm: Normal rate and regular rhythm.  Pulmonary:     Effort: Pulmonary effort is normal. No respiratory distress.     Breath sounds: Normal breath sounds.  Abdominal:     Palpations: Abdomen is soft.     Tenderness: There is no abdominal tenderness. There is no right CVA tenderness or left CVA tenderness.     Comments: Obese abdomen, soft, non-tender.  Musculoskeletal: Normal range of motion.  Skin:    General: Skin is warm and dry.  Neurological:     Mental Status: He is alert and oriented to person, place, and time.  Psychiatric:        Behavior: Behavior normal.      UC Treatments / Results  Labs (all labs ordered are listed, but only abnormal results are displayed) Labs Reviewed  COMPLETE METABOLIC PANEL WITH GFR  GASTROINTESTINAL PATHOGEN PANEL PCR  POCT CBC W AUTO DIFF ([facility])    EKG None  Radiology No results found.  Procedures Procedures (including critical care time)  Medications Ordered in UC Medications - No data to display  Initial Impression / Assessment and Plan / UC Course  I have reviewed the triage vital signs and the nursing notes.  Pertinent labs & imaging results that were available during my care of the patient were reviewed by me and considered in my medical decision making (see chart for details).     Will cover for traveler's diarrhea Blood and stool samples sent to lab. Home care info packet provided F/u with PCP next week if needed. Discussed symptoms that warrant emergent care in the ED.  Final Clinical Impressions(s) / UC Diagnoses   Final diagnoses:  Loose stools   Discharge Instructions   None    ED Prescriptions    Medication Sig Dispense Auth. Provider   azithromycin (ZITHROMAX) 250 MG tablet Take 2 tablets (500 mg total) by mouth daily for 3 days. Take with food 6 tablet Lurene ShadowPhelps, Gwendoline Judy O, PA-C     Controlled Substance Prescriptions [place] Controlled  Substance Registry consulted? Not Applicable   Rolla Platehelps, Kassadi Presswood O, PA-C 10/14/18 1058

## 2018-10-15 ENCOUNTER — Telehealth: Payer: Self-pay | Admitting: Emergency Medicine

## 2018-10-15 LAB — POCT CBC W AUTO DIFF (K'VILLE URGENT CARE)

## 2018-10-15 NOTE — Telephone Encounter (Signed)
Attempted to contact patient with test results. Left message to return message.

## 2018-10-15 NOTE — Telephone Encounter (Signed)
Spoke with patient. Blood work results given. Advised to call PCP office tomorrow and schedule office visit to discuss new onset DM/ A1c testing. Also advised, stool sample still pending. Verbalized understanding.

## 2018-10-19 DIAGNOSIS — R030 Elevated blood-pressure reading, without diagnosis of hypertension: Secondary | ICD-10-CM | POA: Diagnosis not present

## 2018-10-19 DIAGNOSIS — R7309 Other abnormal glucose: Secondary | ICD-10-CM | POA: Diagnosis not present

## 2018-10-19 LAB — GASTROINTESTINAL PATHOGEN PANEL PCR
C. difficile Tox A/B, PCR: UNDETERMINED — AB
Campylobacter, PCR: UNDETERMINED — AB
Cryptosporidium, PCR: UNDETERMINED — AB
E coli (ETEC) LT/ST PCR: UNDETERMINED — AB
E coli (STEC) stx1/stx2, PCR: UNDETERMINED — AB
E coli 0157, PCR: UNDETERMINED — AB
Giardia lamblia, PCR: UNDETERMINED — AB
Norovirus, PCR: UNDETERMINED — AB
Rotavirus A, PCR: UNDETERMINED — AB
Salmonella, PCR: UNDETERMINED — AB
Shigella, PCR: UNDETERMINED — AB

## 2018-10-19 LAB — COMPLETE METABOLIC PANEL WITH GFR

## 2018-10-20 ENCOUNTER — Telehealth: Payer: Self-pay | Admitting: *Deleted

## 2018-10-20 NOTE — Telephone Encounter (Signed)
Callback: Patient given stool results of "inderterminate". He has seen PCP and is improving.

## 2018-10-26 DIAGNOSIS — E119 Type 2 diabetes mellitus without complications: Secondary | ICD-10-CM | POA: Diagnosis not present

## 2018-11-24 ENCOUNTER — Encounter: Payer: BLUE CROSS/BLUE SHIELD | Attending: Family Medicine | Admitting: *Deleted

## 2018-11-24 DIAGNOSIS — E119 Type 2 diabetes mellitus without complications: Secondary | ICD-10-CM | POA: Insufficient documentation

## 2018-11-24 NOTE — Patient Instructions (Signed)
Plan:  Aim for 3 Carb Choices per meal (45 grams) +/- 1 either way  Aim for 0-2 Carbs per snack if hungry  Include protein in moderation with your meals and snacks Continue reading food labels for Total Carbohydrate of foods Consider  increasing your activity level as tolerated Continue checking BG at alternate times per day  Continue taking medication as directed by MD

## 2018-11-24 NOTE — Progress Notes (Signed)
Diabetes Self-Management Education  Visit Type: First/Initial  Appt. Start Time: 0945 Appt. End Time: 1045  11/24/2018  Mr. Fernando Martinez, identified by name and date of birth, is a 38 y.o. male with a diagnosis of Diabetes: Type 2. He is newly diagnosed and has adapted a very low carb diet plan and is checking his BG over 4 times a day. 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 Some weeks he gets plenty of walking in, other weeks he is more sedentary at computer desk. He states history of dieting and wide swings in his weight throughout his life. He is currently avoiding carbohydrate foods.   ASSESSMENT  There were no vitals taken for this visit. There is no height or weight on file to calculate BMI.  Diabetes Self-Management Education - 11/24/18 0944      Visit Information   Visit Type  First/Initial      Initial Visit   Diabetes Type  Type 2    Are you currently following a meal plan?  Yes    What type of meal plan do you follow?  low carb     Are you taking your medications as prescribed?  Yes    Date Diagnosed  Jan 2020      Health Coping   How would you rate your overall health?  Fair      Psychosocial Assessment   Patient Belief/Attitude about Diabetes  Motivated to manage diabetes    Self-care barriers  None    Self-management support  Family    Other persons present  Patient    Patient Concerns  Nutrition/Meal planning;Weight Control;Glycemic Control    Special Needs  None    Preferred Learning Style  No preference indicated    Learning Readiness  Change in progress    How often do you need to have someone help you when you read instructions, pamphlets, or other written materials from your doctor or pharmacy?  1 - Never    What is the last grade level you completed in school?  college      Pre-Education Assessment   Patient understands the diabetes disease and treatment process.  Needs Instruction    Patient  understands incorporating nutritional management into lifestyle.  Needs Instruction    Patient undertands incorporating physical activity into lifestyle.  Needs Instruction    Patient understands using medications safely.  Needs Instruction    Patient understands monitoring blood glucose, interpreting and using results  Needs Instruction    Patient understands prevention, detection, and treatment of acute complications.  Needs Instruction    Patient understands prevention, detection, and treatment of chronic complications.  Needs Instruction    Patient understands how to develop strategies to address psychosocial issues.  Needs Instruction    Patient understands how to develop strategies to promote health/change behavior.  Needs Instruction      Complications   Last HgB A1C per patient/outside source  7.8 %    How often do you check your blood sugar?  > 4 times/day    Fasting Blood glucose range (mg/dL)  25-189    Postprandial Blood glucose range (mg/dL)  84-210    Number of hypoglycemic episodes per month  0    Have you had a dilated eye exam in the past 12 months?  No    Have you had a dental exam in the past 12 months?  No    Are you checking  your feet?  No      Dietary Intake   Breakfast  grab something fast - protein shake (Atkins) OR     Snack (morning)  jerky, cheese, mixed nuts     Lunch  skipped historically, now burger without bread OR chicken and vegetables OR meat and side salad    Snack (afternoon)  same as AM, chicken salad,     Dinner  he prepares dinner meal, or order out. eggs, bacon, sausage OR meat and vegetables     Snack (evening)  same as other snacks    Beverage(s)  diet soda (Coke Zero) water, green tea, occasionally whiskey or other alcohol beverage x1 or 2 every other day before diabetes diagnosis      Exercise   Exercise Type  ADL's   gets steps at work, may go to gym     Patient Education   Previous Diabetes Education  No    Disease state   Definition of  diabetes, type 1 and 2, and the diagnosis of diabetes;Factors that contribute to the development of diabetes    Nutrition management   Role of diet in the treatment of diabetes and the relationship between the three main macronutrients and blood glucose level;Food label reading, portion sizes and measuring food.;Carbohydrate counting;Reviewed blood glucose goals for pre and post meals and how to evaluate the patients' food intake on their blood glucose level.    Physical activity and exercise   Role of exercise on diabetes management, blood pressure control and cardiac health.;Helped patient identify appropriate exercises in relation to his/her diabetes, diabetes complications and other health issue.    Medications  Reviewed patients medication for diabetes, action, purpose, timing of dose and side effects.    Monitoring  Identified appropriate SMBG and/or A1C goals.;Purpose and frequency of SMBG.    Chronic complications  Relationship between chronic complications and blood glucose control    Psychosocial adjustment  Role of stress on diabetes      Individualized Goals (developed by patient)   Nutrition  Follow meal plan discussed    Physical Activity  Exercise 3-5 times per week    Medications  take my medication as prescribed    Monitoring   test blood glucose pre and post meals as discussed      Post-Education Assessment   Patient understands the diabetes disease and treatment process.  Demonstrates understanding / competency    Patient understands incorporating nutritional management into lifestyle.  Demonstrates understanding / competency    Patient undertands incorporating physical activity into lifestyle.  Demonstrates understanding / competency    Patient understands using medications safely.  Demonstrates understanding / competency    Patient understands monitoring blood glucose, interpreting and using results  Demonstrates understanding / competency    Patient understands prevention,  detection, and treatment of acute complications.  Demonstrates understanding / competency    Patient understands prevention, detection, and treatment of chronic complications.  Demonstrates understanding / competency    Patient understands how to develop strategies to address psychosocial issues.  Demonstrates understanding / competency    Patient understands how to develop strategies to promote health/change behavior.  Demonstrates understanding / competency      Outcomes   Expected Outcomes  Demonstrated interest in learning. Expect positive outcomes    Future DMSE  PRN    Program Status  Completed       Individualized Plan for Diabetes Self-Management Training:   Learning Objective:  Patient will have a greater understanding of diabetes  self-management. Patient education plan is to attend individual and/or group sessions per assessed needs and concerns.   Plan:   Patient Instructions  Plan:  Aim for 3 Carb Choices per meal (45 grams) +/- 1 either way  Aim for 0-2 Carbs per snack if hungry  Include protein in moderation with your meals and snacks Continue reading food labels for Total Carbohydrate of foods Consider  increasing your activity level as tolerated Continue checking BG at alternate times per day  Continue taking medication as directed by MD  Expected Outcomes:  Demonstrated interest in learning. Expect positive outcomes  Education material provided: ADA Diabetes: Your Take Control Guide, A1C conversion sheet, Meal plan card and Carbohydrate counting sheet  If problems or questions, patient to contact team via:  Phone  Future DSME appointment: PRN

## 2018-12-12 DIAGNOSIS — G4733 Obstructive sleep apnea (adult) (pediatric): Secondary | ICD-10-CM | POA: Diagnosis not present

## 2018-12-26 DIAGNOSIS — F902 Attention-deficit hyperactivity disorder, combined type: Secondary | ICD-10-CM | POA: Diagnosis not present

## 2018-12-26 DIAGNOSIS — Z79899 Other long term (current) drug therapy: Secondary | ICD-10-CM | POA: Diagnosis not present

## 2018-12-26 DIAGNOSIS — F419 Anxiety disorder, unspecified: Secondary | ICD-10-CM | POA: Diagnosis not present

## 2018-12-26 DIAGNOSIS — G4733 Obstructive sleep apnea (adult) (pediatric): Secondary | ICD-10-CM | POA: Diagnosis not present

## 2019-01-05 DIAGNOSIS — M545 Low back pain: Secondary | ICD-10-CM | POA: Diagnosis not present

## 2019-01-07 IMAGING — CT CT RENAL STONE PROTOCOL
2 of 4 series · 16 of 46 positions shown, 18 images · non-contrast
Comparison: None.

CLINICAL DATA: Acute left lower quadrant abdominal pain, gross
hematuria.

EXAM:
CT ABDOMEN AND PELVIS WITHOUT CONTRAST
TECHNIQUE: Multidetector CT imaging of the abdomen and pelvis was performed
following the standard protocol without IV contrast.

[Series 2: axial st · axial · 0.98mm/px · z∈[-494,-9]mm · 13 of 107 slices shown, 15 images]
[im 5/107  soft-tissue]
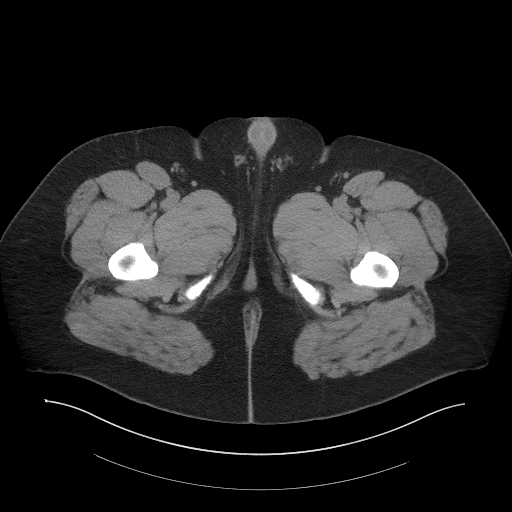
[im 5/107  bone]
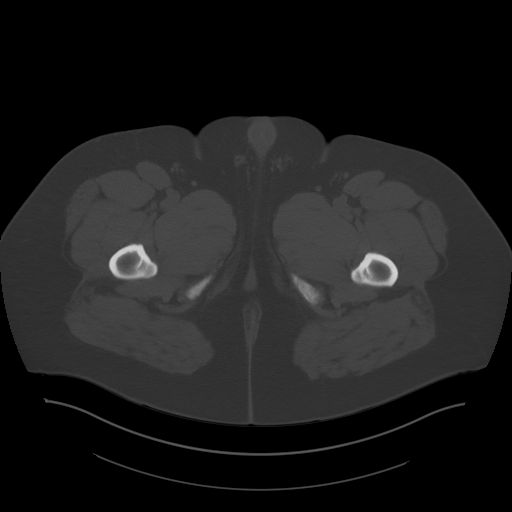
[im 15/107  soft-tissue]
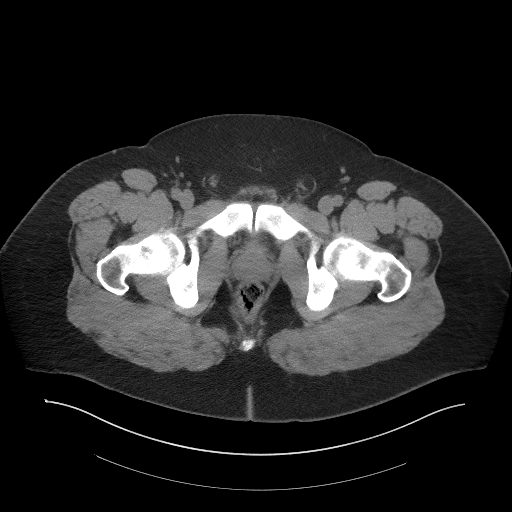
[im 25/107  soft-tissue]
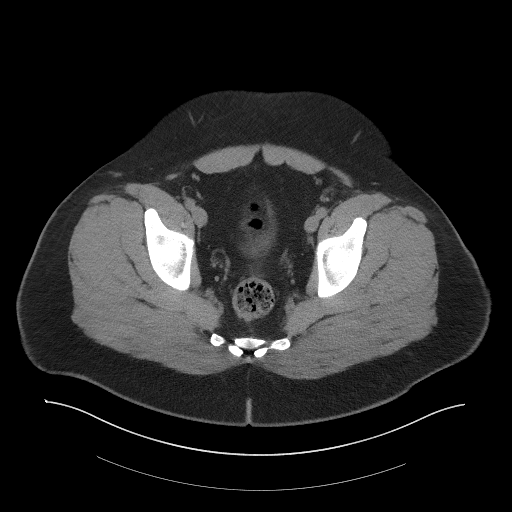
[im 29/107  soft-tissue]
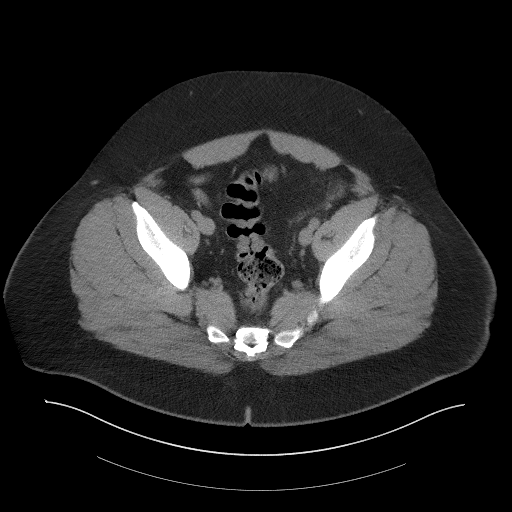
[im 39/107  soft-tissue]
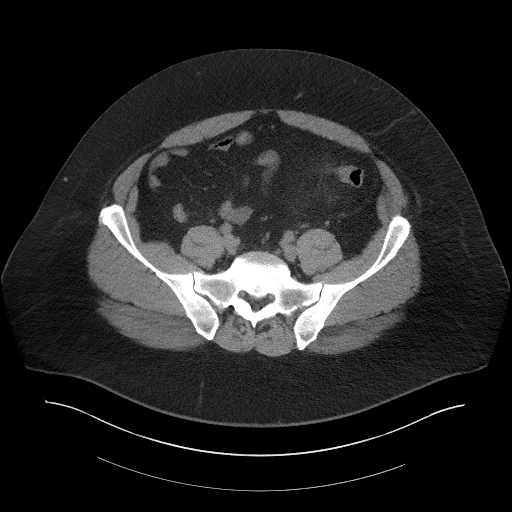
[im 44/107  soft-tissue]
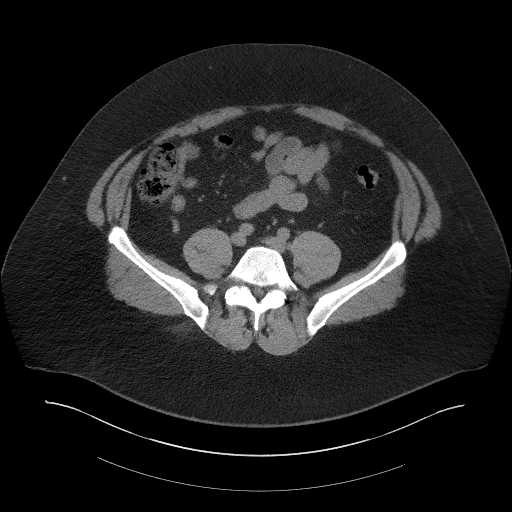
[im 54/107  soft-tissue]
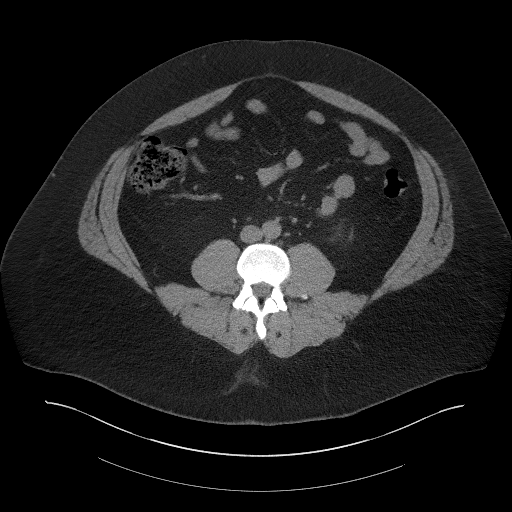
[im 63/107  soft-tissue]
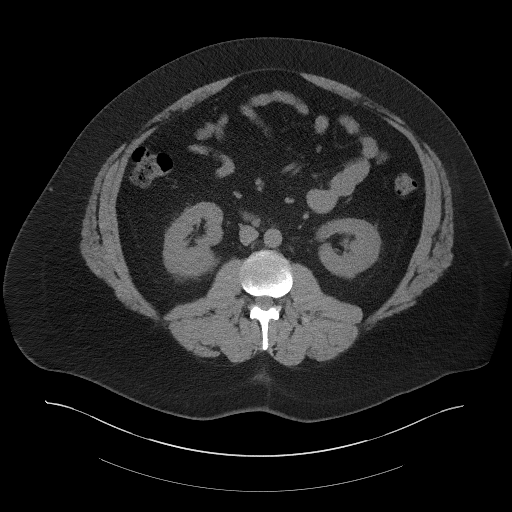
[im 68/107  soft-tissue]
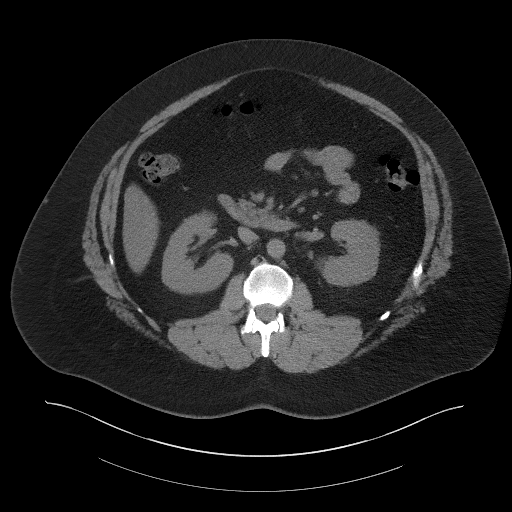
[im 68/107  bone]
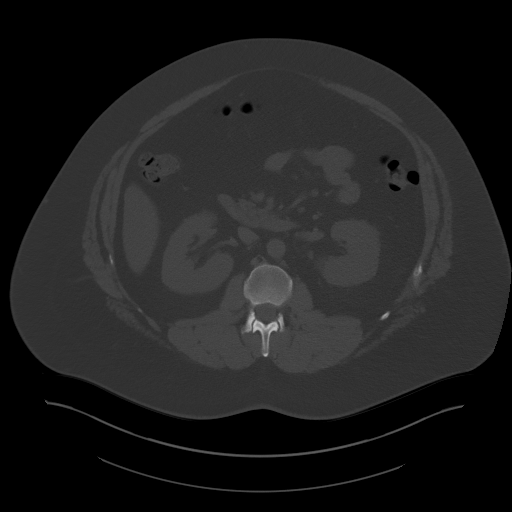
[im 78/107  soft-tissue]
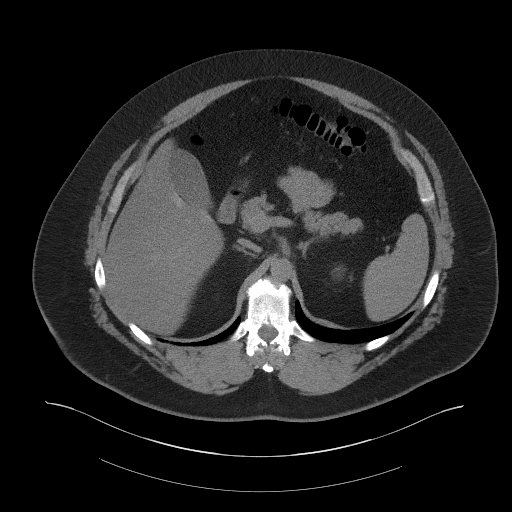
[im 82/107  soft-tissue]
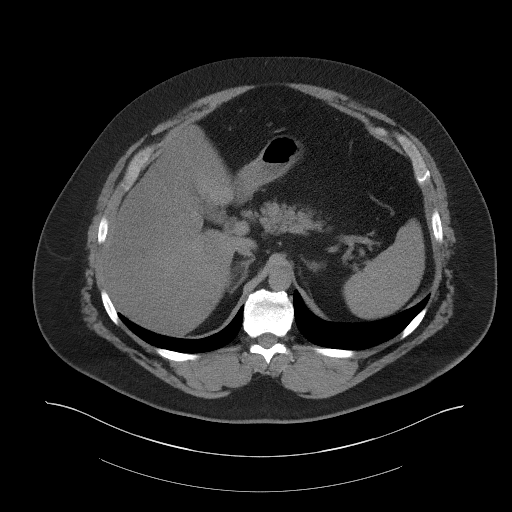
[im 92/107  soft-tissue]
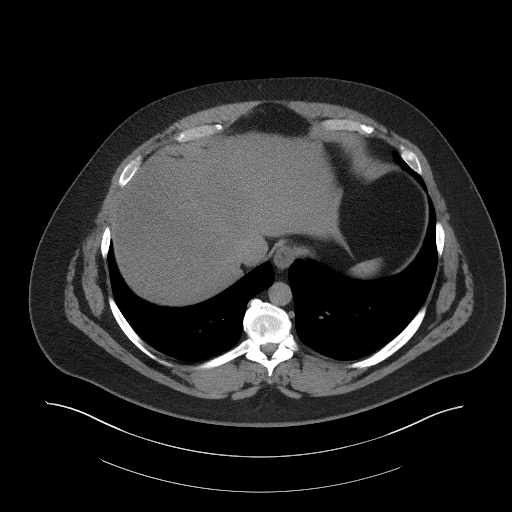
[im 102/107  soft-tissue]
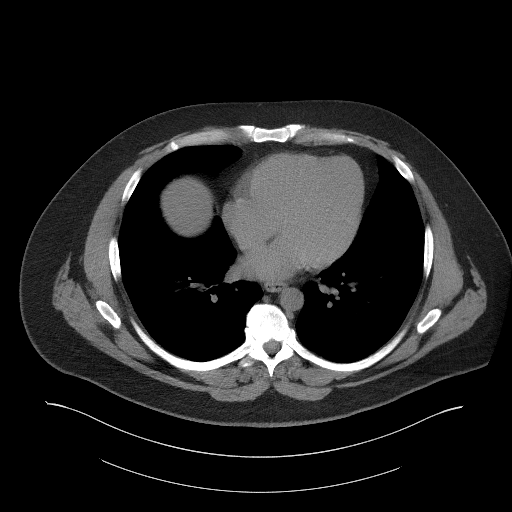

[Series 5: coronal st · coronal · 0.93mm/px · 3 of 134 slices shown]
[im 45/134  soft-tissue]
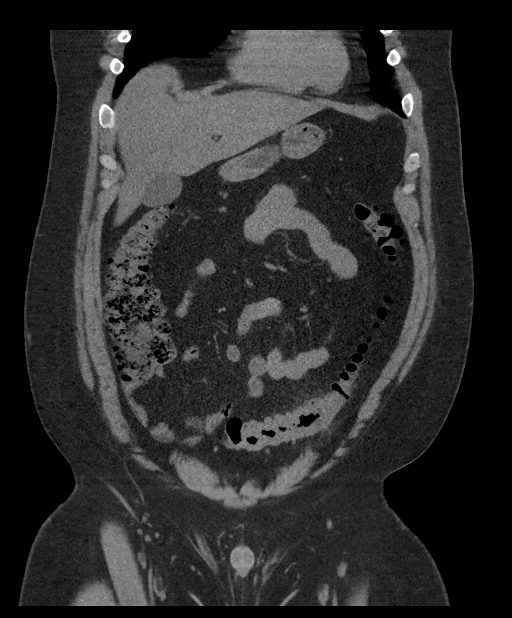
[im 60/134  soft-tissue]
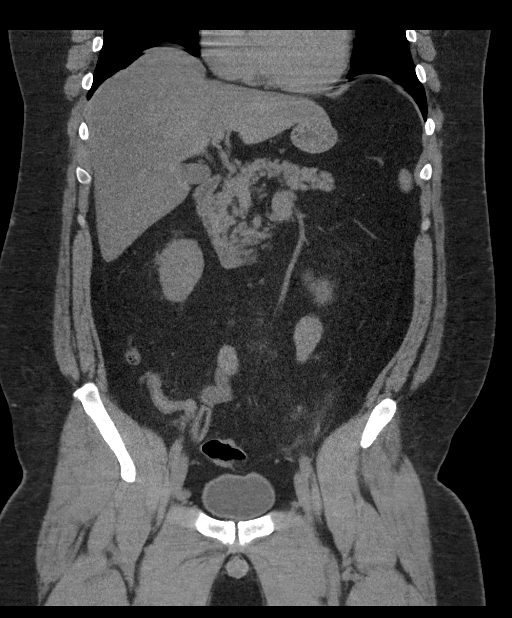
[im 74/134  soft-tissue]
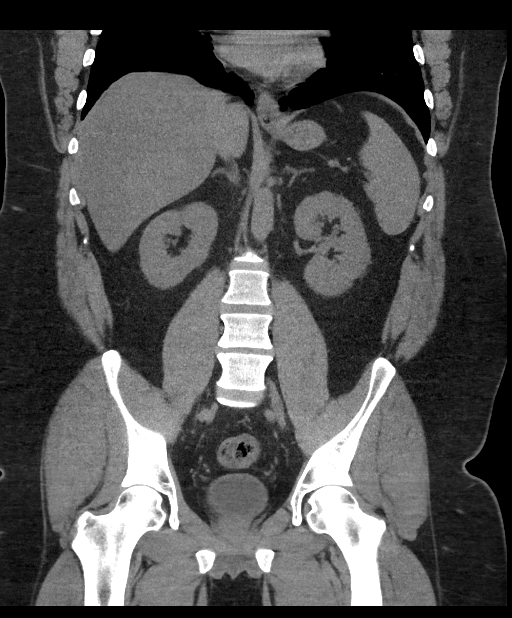

[16 of 46 positions shown; findings below may reference images not displayed]

FINDINGS: Lower chest: No acute abnormality.

Hepatobiliary: No gallstones are noted. Fatty infiltration of the
liver is noted. No biliary dilatation is noted.

Pancreas: Unremarkable. No pancreatic ductal dilatation or
surrounding inflammatory changes.

Spleen: Normal in size without focal abnormality.

Adrenals/Urinary Tract: Adrenal glands are unremarkable. Kidneys are
normal, without renal calculi, focal lesion, or hydronephrosis.
Bladder is unremarkable.

Stomach/Bowel: The stomach appears normal. The appendix appears
normal. There is no evidence of bowel obstruction. Focal
diverticulitis is noted at the junction of the descending and
sigmoid colon. No abscess is noted.

Vascular/Lymphatic: No significant vascular findings are present. No
enlarged abdominal or pelvic lymph nodes.

Reproductive: Prostate is unremarkable.

Other: No abdominal wall hernia or abnormality. No abdominopelvic
ascites.

Musculoskeletal: No acute or significant osseous findings.
IMPRESSION: Focal diverticulitis noted at the junction of the descending and
sigmoid colon. No abscess or evidence of perforation is noted.

Fatty infiltration of the liver.

## 2019-01-25 DIAGNOSIS — E119 Type 2 diabetes mellitus without complications: Secondary | ICD-10-CM | POA: Diagnosis not present

## 2019-03-29 DIAGNOSIS — F419 Anxiety disorder, unspecified: Secondary | ICD-10-CM | POA: Diagnosis not present

## 2019-03-29 DIAGNOSIS — G4733 Obstructive sleep apnea (adult) (pediatric): Secondary | ICD-10-CM | POA: Diagnosis not present

## 2019-03-29 DIAGNOSIS — F902 Attention-deficit hyperactivity disorder, combined type: Secondary | ICD-10-CM | POA: Diagnosis not present

## 2019-03-29 DIAGNOSIS — Z79899 Other long term (current) drug therapy: Secondary | ICD-10-CM | POA: Diagnosis not present

## 2019-03-30 DIAGNOSIS — E119 Type 2 diabetes mellitus without complications: Secondary | ICD-10-CM | POA: Diagnosis not present

## 2019-06-28 DIAGNOSIS — F902 Attention-deficit hyperactivity disorder, combined type: Secondary | ICD-10-CM | POA: Diagnosis not present

## 2019-06-28 DIAGNOSIS — G4733 Obstructive sleep apnea (adult) (pediatric): Secondary | ICD-10-CM | POA: Diagnosis not present

## 2019-06-28 DIAGNOSIS — F419 Anxiety disorder, unspecified: Secondary | ICD-10-CM | POA: Diagnosis not present

## 2019-06-28 DIAGNOSIS — Z79899 Other long term (current) drug therapy: Secondary | ICD-10-CM | POA: Diagnosis not present

## 2019-08-27 DIAGNOSIS — Z0000 Encounter for general adult medical examination without abnormal findings: Secondary | ICD-10-CM | POA: Diagnosis not present

## 2019-09-14 DIAGNOSIS — Z13220 Encounter for screening for lipoid disorders: Secondary | ICD-10-CM | POA: Diagnosis not present

## 2019-09-14 DIAGNOSIS — E119 Type 2 diabetes mellitus without complications: Secondary | ICD-10-CM | POA: Diagnosis not present

## 2019-09-20 DIAGNOSIS — F419 Anxiety disorder, unspecified: Secondary | ICD-10-CM | POA: Diagnosis not present

## 2019-09-20 DIAGNOSIS — Z79899 Other long term (current) drug therapy: Secondary | ICD-10-CM | POA: Diagnosis not present

## 2019-09-20 DIAGNOSIS — G4733 Obstructive sleep apnea (adult) (pediatric): Secondary | ICD-10-CM | POA: Diagnosis not present

## 2019-09-20 DIAGNOSIS — F902 Attention-deficit hyperactivity disorder, combined type: Secondary | ICD-10-CM | POA: Diagnosis not present

## 2019-09-26 DIAGNOSIS — G4733 Obstructive sleep apnea (adult) (pediatric): Secondary | ICD-10-CM | POA: Diagnosis not present

## 2019-10-01 DIAGNOSIS — M79676 Pain in unspecified toe(s): Secondary | ICD-10-CM | POA: Diagnosis not present

## 2019-10-01 DIAGNOSIS — F329 Major depressive disorder, single episode, unspecified: Secondary | ICD-10-CM | POA: Diagnosis not present

## 2019-10-01 DIAGNOSIS — E785 Hyperlipidemia, unspecified: Secondary | ICD-10-CM | POA: Diagnosis not present

## 2019-10-01 DIAGNOSIS — E119 Type 2 diabetes mellitus without complications: Secondary | ICD-10-CM | POA: Diagnosis not present

## 2019-10-10 DIAGNOSIS — Z20822 Contact with and (suspected) exposure to covid-19: Secondary | ICD-10-CM | POA: Diagnosis not present

## 2019-10-30 DIAGNOSIS — N289 Disorder of kidney and ureter, unspecified: Secondary | ICD-10-CM | POA: Diagnosis not present

## 2019-10-31 DIAGNOSIS — Z3141 Encounter for fertility testing: Secondary | ICD-10-CM | POA: Diagnosis not present

## 2019-11-08 DIAGNOSIS — G4733 Obstructive sleep apnea (adult) (pediatric): Secondary | ICD-10-CM | POA: Diagnosis not present

## 2019-12-06 DIAGNOSIS — N529 Male erectile dysfunction, unspecified: Secondary | ICD-10-CM | POA: Diagnosis not present

## 2019-12-20 ENCOUNTER — Ambulatory Visit: Payer: Self-pay | Attending: Internal Medicine

## 2019-12-20 DIAGNOSIS — Z23 Encounter for immunization: Secondary | ICD-10-CM

## 2019-12-20 NOTE — Progress Notes (Signed)
   Covid-19 Vaccination Clinic  Name:  Termaine Roupp    MRN: [id number] DOB: 08-21-1981  12/20/2019  Mr. Dugar was observed post Covid-19 immunization for 15 minutes without incident. He was provided with Vaccine Information Sheet and instruction to access the V-Safe system.   Mr. Marcin was instructed to call 911 with any severe reactions post vaccine: Marland Kitchen Difficulty breathing  . Swelling of face and throat  . A fast heartbeat  . A bad rash all over body  . Dizziness and weakness   Immunizations Administered    Name Date Dose VIS Date Route   Pfizer COVID-19 Vaccine 12/20/2019  3:26 PM 0.3 mL 09/14/2019 Intramuscular   Manufacturer: [employer], [employer]   Lot: [id number]   NDC: [address]-3

## 2020-01-14 ENCOUNTER — Ambulatory Visit: Payer: Self-pay | Attending: Internal Medicine

## 2020-01-14 DIAGNOSIS — Z23 Encounter for immunization: Secondary | ICD-10-CM

## 2020-01-14 NOTE — Progress Notes (Signed)
   Covid-19 Vaccination Clinic  Name:  Fernando Martinez    MRN: [id number] DOB: 15-Jul-1981  01/14/2020  Fernando Martinez was observed post Covid-19 immunization for 15 minutes without incident. He was provided with Vaccine Information Sheet and instruction to access the V-Safe system.   Fernando Martinez was instructed to call 911 with any severe reactions post vaccine: Marland Kitchen Difficulty breathing  . Swelling of face and throat  . A fast heartbeat  . A bad rash all over body  . Dizziness and weakness   Immunizations Administered    Name Date Dose VIS Date Route   Pfizer COVID-19 Vaccine 01/14/2020  3:41 PM 0.3 mL 09/14/2019 Intramuscular   Manufacturer: [employer], [employer]   Lot: [id number]   NDC: [address]-7

## 2020-01-18 DIAGNOSIS — F419 Anxiety disorder, unspecified: Secondary | ICD-10-CM | POA: Diagnosis not present

## 2020-01-18 DIAGNOSIS — F902 Attention-deficit hyperactivity disorder, combined type: Secondary | ICD-10-CM | POA: Diagnosis not present

## 2020-01-18 DIAGNOSIS — Z79899 Other long term (current) drug therapy: Secondary | ICD-10-CM | POA: Diagnosis not present

## 2020-01-18 DIAGNOSIS — G4733 Obstructive sleep apnea (adult) (pediatric): Secondary | ICD-10-CM | POA: Diagnosis not present

## 2020-02-29 DIAGNOSIS — E119 Type 2 diabetes mellitus without complications: Secondary | ICD-10-CM | POA: Diagnosis not present

## 2020-02-29 DIAGNOSIS — E785 Hyperlipidemia, unspecified: Secondary | ICD-10-CM | POA: Diagnosis not present

## 2020-03-24 DIAGNOSIS — F909 Attention-deficit hyperactivity disorder, unspecified type: Secondary | ICD-10-CM | POA: Diagnosis not present

## 2020-03-24 DIAGNOSIS — E785 Hyperlipidemia, unspecified: Secondary | ICD-10-CM | POA: Diagnosis not present

## 2020-03-24 DIAGNOSIS — E1165 Type 2 diabetes mellitus with hyperglycemia: Secondary | ICD-10-CM | POA: Diagnosis not present

## 2020-03-24 DIAGNOSIS — Z6841 Body Mass Index (BMI) 40.0 and over, adult: Secondary | ICD-10-CM | POA: Diagnosis not present

## 2020-04-10 DIAGNOSIS — E785 Hyperlipidemia, unspecified: Secondary | ICD-10-CM | POA: Diagnosis not present

## 2020-04-10 DIAGNOSIS — Z6841 Body Mass Index (BMI) 40.0 and over, adult: Secondary | ICD-10-CM | POA: Diagnosis not present

## 2020-04-10 DIAGNOSIS — F909 Attention-deficit hyperactivity disorder, unspecified type: Secondary | ICD-10-CM | POA: Diagnosis not present

## 2020-04-10 DIAGNOSIS — E1165 Type 2 diabetes mellitus with hyperglycemia: Secondary | ICD-10-CM | POA: Diagnosis not present

## 2020-04-15 DIAGNOSIS — F419 Anxiety disorder, unspecified: Secondary | ICD-10-CM | POA: Diagnosis not present

## 2020-04-15 DIAGNOSIS — F902 Attention-deficit hyperactivity disorder, combined type: Secondary | ICD-10-CM | POA: Diagnosis not present

## 2020-04-15 DIAGNOSIS — G4733 Obstructive sleep apnea (adult) (pediatric): Secondary | ICD-10-CM | POA: Diagnosis not present

## 2020-04-15 DIAGNOSIS — Z79899 Other long term (current) drug therapy: Secondary | ICD-10-CM | POA: Diagnosis not present

## 2020-04-24 DIAGNOSIS — E1165 Type 2 diabetes mellitus with hyperglycemia: Secondary | ICD-10-CM | POA: Diagnosis not present

## 2020-04-24 DIAGNOSIS — Z6841 Body Mass Index (BMI) 40.0 and over, adult: Secondary | ICD-10-CM | POA: Diagnosis not present

## 2020-04-24 DIAGNOSIS — E785 Hyperlipidemia, unspecified: Secondary | ICD-10-CM | POA: Diagnosis not present

## 2020-04-24 DIAGNOSIS — F909 Attention-deficit hyperactivity disorder, unspecified type: Secondary | ICD-10-CM | POA: Diagnosis not present

## 2020-06-02 DIAGNOSIS — E1165 Type 2 diabetes mellitus with hyperglycemia: Secondary | ICD-10-CM | POA: Diagnosis not present

## 2020-06-02 DIAGNOSIS — E785 Hyperlipidemia, unspecified: Secondary | ICD-10-CM | POA: Diagnosis not present

## 2020-06-02 DIAGNOSIS — F329 Major depressive disorder, single episode, unspecified: Secondary | ICD-10-CM | POA: Diagnosis not present

## 2020-06-05 DIAGNOSIS — E1165 Type 2 diabetes mellitus with hyperglycemia: Secondary | ICD-10-CM | POA: Diagnosis not present

## 2020-06-05 DIAGNOSIS — F909 Attention-deficit hyperactivity disorder, unspecified type: Secondary | ICD-10-CM | POA: Diagnosis not present

## 2020-06-05 DIAGNOSIS — Z6841 Body Mass Index (BMI) 40.0 and over, adult: Secondary | ICD-10-CM | POA: Diagnosis not present

## 2020-06-05 DIAGNOSIS — E785 Hyperlipidemia, unspecified: Secondary | ICD-10-CM | POA: Diagnosis not present

## 2020-07-09 DIAGNOSIS — G4733 Obstructive sleep apnea (adult) (pediatric): Secondary | ICD-10-CM | POA: Diagnosis not present

## 2020-07-14 DIAGNOSIS — F419 Anxiety disorder, unspecified: Secondary | ICD-10-CM | POA: Diagnosis not present

## 2020-07-14 DIAGNOSIS — F902 Attention-deficit hyperactivity disorder, combined type: Secondary | ICD-10-CM | POA: Diagnosis not present

## 2020-07-14 DIAGNOSIS — Z79899 Other long term (current) drug therapy: Secondary | ICD-10-CM | POA: Diagnosis not present

## 2020-09-04 DIAGNOSIS — E1165 Type 2 diabetes mellitus with hyperglycemia: Secondary | ICD-10-CM | POA: Diagnosis not present

## 2020-09-11 DIAGNOSIS — Z6841 Body Mass Index (BMI) 40.0 and over, adult: Secondary | ICD-10-CM | POA: Diagnosis not present

## 2020-09-11 DIAGNOSIS — E785 Hyperlipidemia, unspecified: Secondary | ICD-10-CM | POA: Diagnosis not present

## 2020-09-11 DIAGNOSIS — F909 Attention-deficit hyperactivity disorder, unspecified type: Secondary | ICD-10-CM | POA: Diagnosis not present

## 2020-09-11 DIAGNOSIS — E1165 Type 2 diabetes mellitus with hyperglycemia: Secondary | ICD-10-CM | POA: Diagnosis not present

## 2020-09-15 DIAGNOSIS — Z13220 Encounter for screening for lipoid disorders: Secondary | ICD-10-CM | POA: Diagnosis not present

## 2020-09-15 DIAGNOSIS — Z0000 Encounter for general adult medical examination without abnormal findings: Secondary | ICD-10-CM | POA: Diagnosis not present

## 2021-03-31 ENCOUNTER — Other Ambulatory Visit: Payer: Self-pay | Admitting: Physician Assistant

## 2021-03-31 DIAGNOSIS — R112 Nausea with vomiting, unspecified: Secondary | ICD-10-CM

## 2021-03-31 DIAGNOSIS — R1013 Epigastric pain: Secondary | ICD-10-CM

## 2021-04-02 ENCOUNTER — Ambulatory Visit
Admission: RE | Admit: 2021-04-02 | Discharge: 2021-04-02 | Disposition: A | Discharge status: Home / Self Care | Payer: 59 | Source: Ambulatory Visit | Attending: Physician Assistant | Admitting: Physician Assistant

## 2021-04-02 DIAGNOSIS — R112 Nausea with vomiting, unspecified: Secondary | ICD-10-CM

## 2021-04-02 DIAGNOSIS — R1013 Epigastric pain: Secondary | ICD-10-CM

## 2022-03-16 IMAGING — US US ABDOMEN LIMITED
1 series · 14 of 25 positions shown · non-contrast
Comparison: None.

CLINICAL DATA: 40-year-old male with abdominal pain.

EXAM:
ULTRASOUND ABDOMEN LIMITED RIGHT UPPER QUADRANT

[Series 1: us abdomen limited · 0.28mm/px · 14 of 61 slices shown]
[im 1/61]
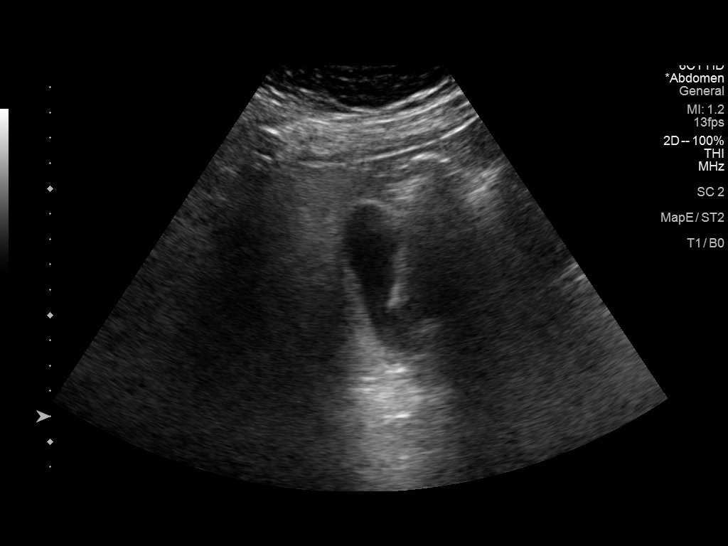
[im 6/61]
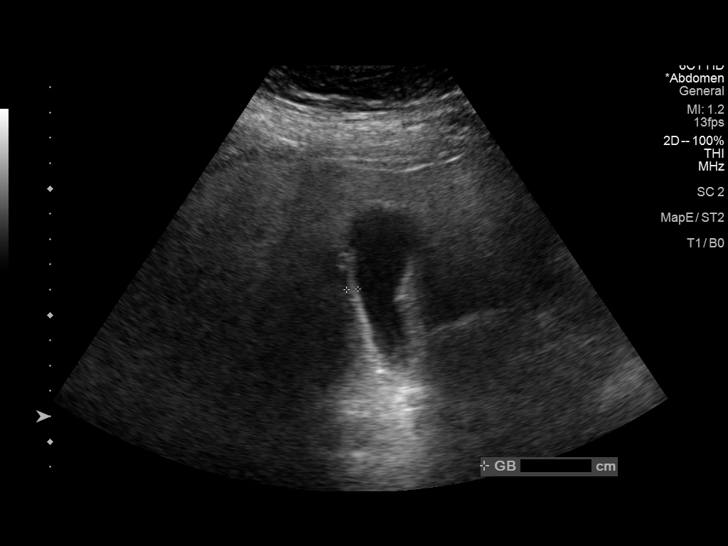
[im 11/61]
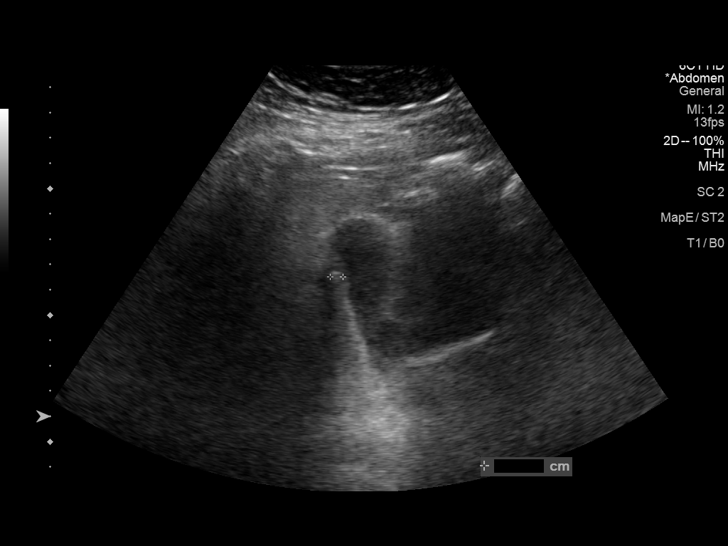
[im 16/61]
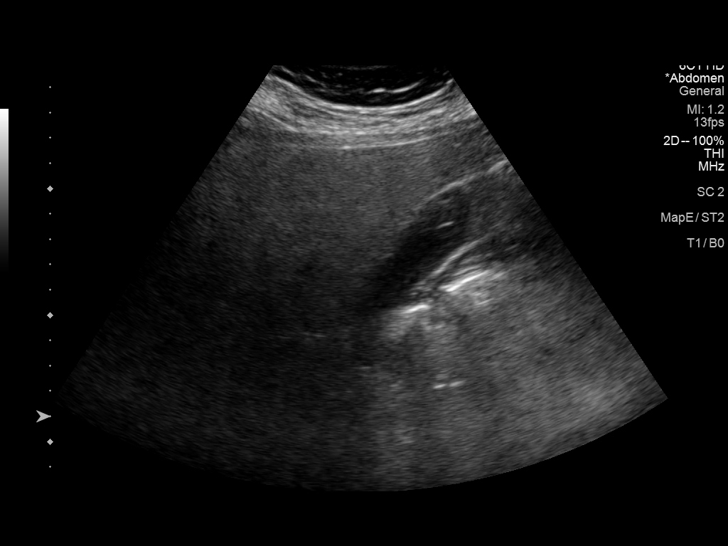
[im 21/61]
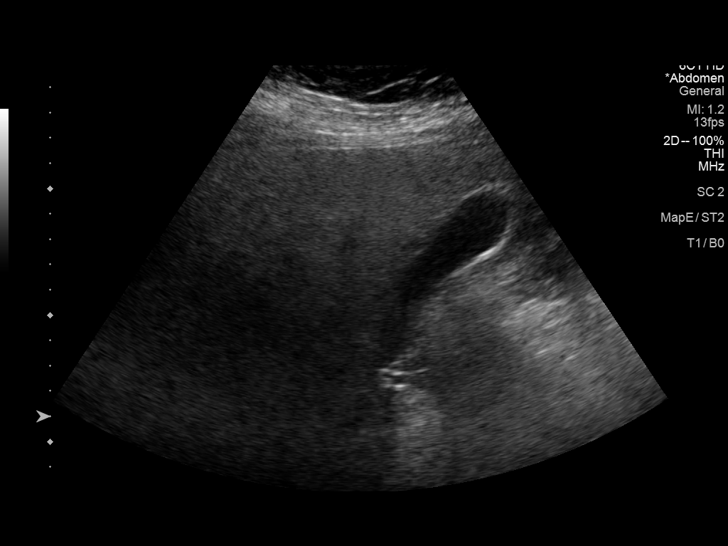
[im 23/61]
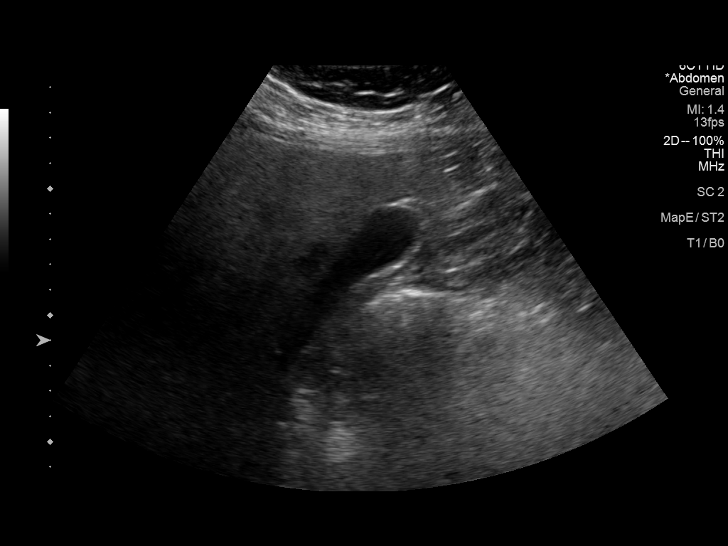
[im 28/61]
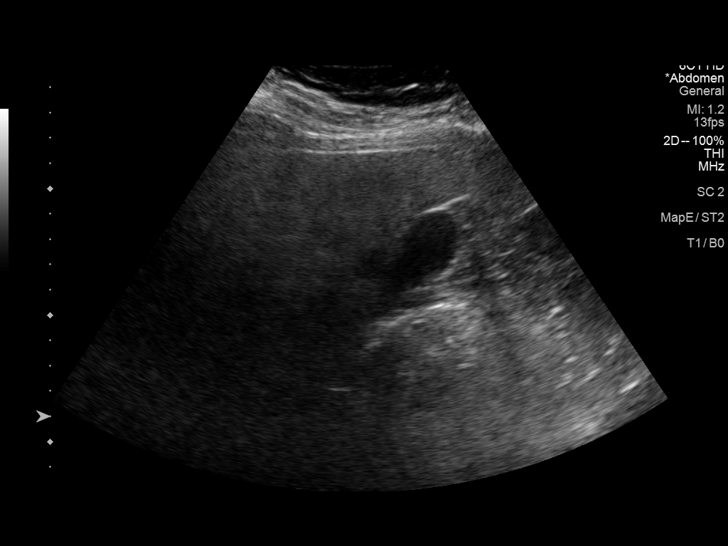
[im 33/61]
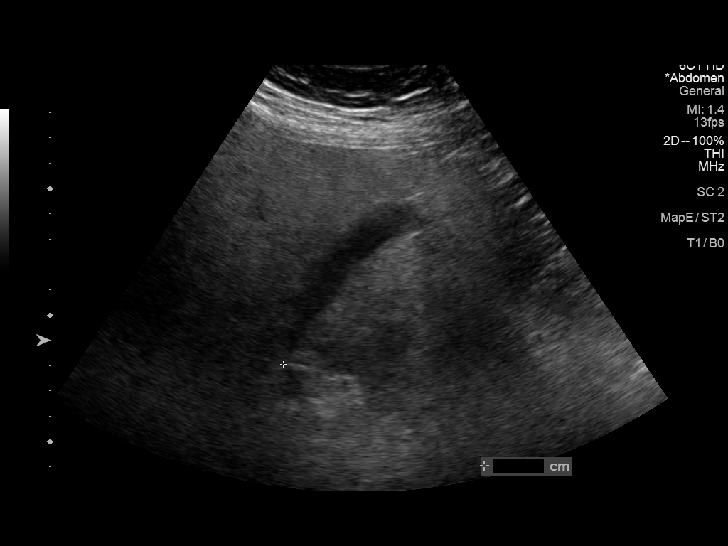
[im 38/61]
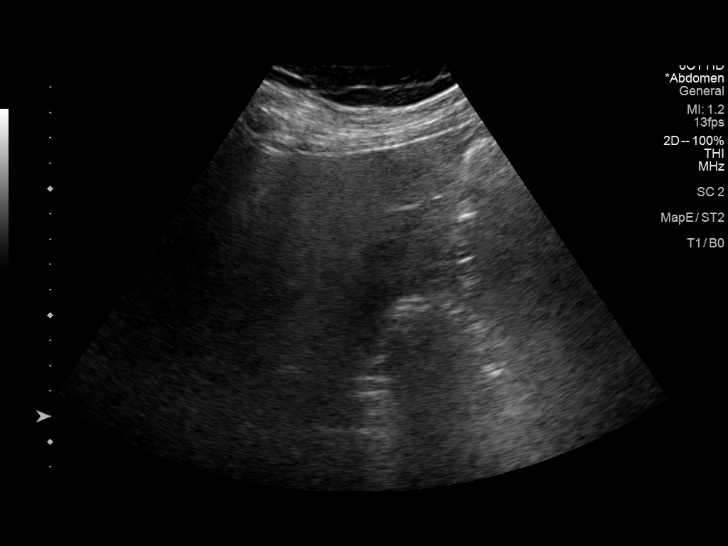
[im 41/61]
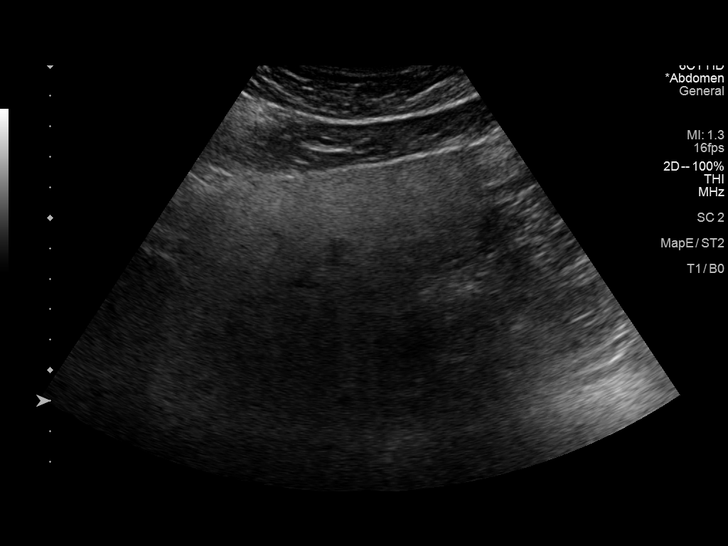
[im 46/61]
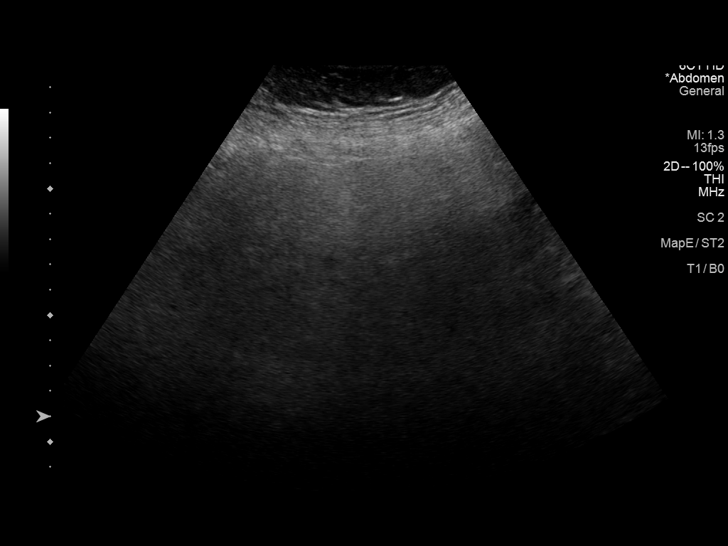
[im 51/61]
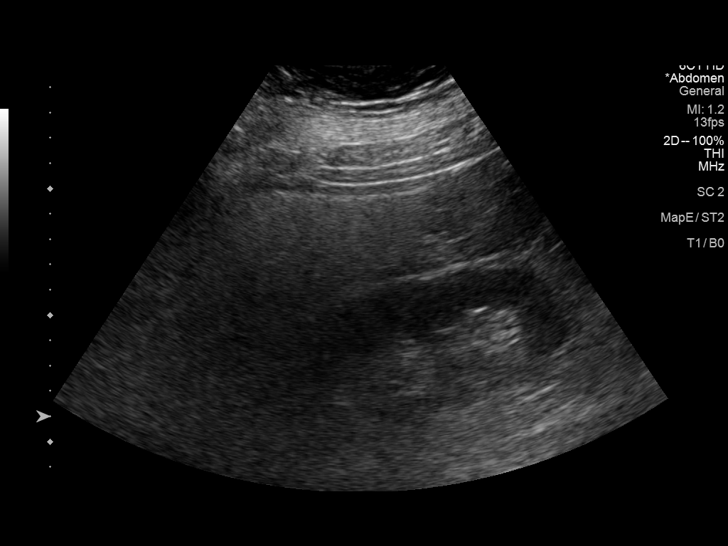
[im 56/61]
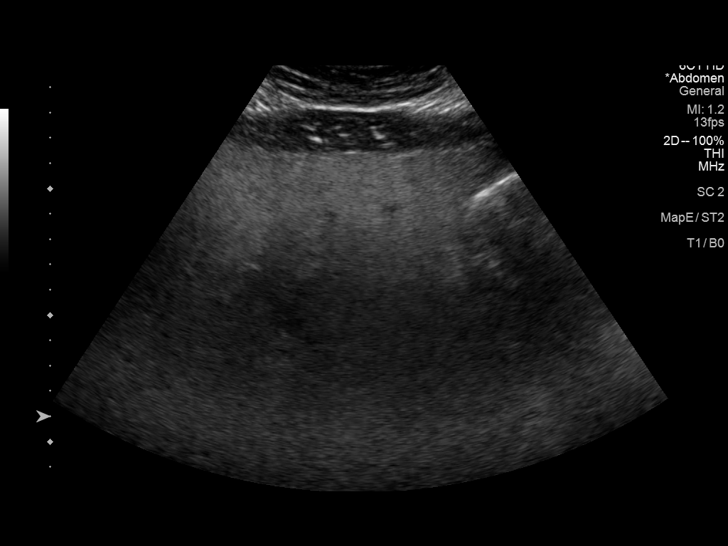
[im 61/61]
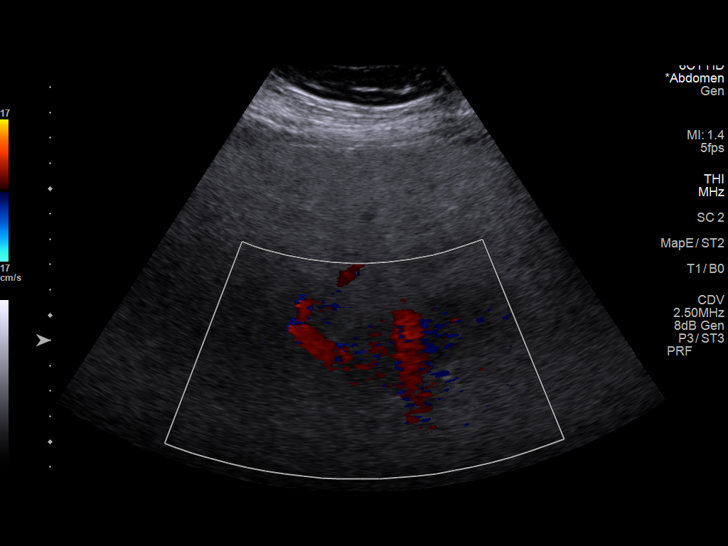

[14 of 25 positions shown; findings below may reference images not displayed]

FINDINGS: Evaluation is limited due to body habitus.

Gallbladder:

No gallstones or wall thickening visualized. No sonographic Murphy
sign noted by sonographer.

Common bile duct:

Diameter: 6 mm

Liver:

There is diffuse increased liver echogenicity most commonly seen in
the setting of fatty infiltration. Superimposed inflammation or
fibrosis is not excluded. Clinical correlation is recommended.
Portal vein is patent on color Doppler imaging with normal direction
of blood flow towards the liver.

Other: None.
IMPRESSION: Fatty liver, otherwise unremarkable right upper quadrant ultrasound.

## 2023-09-30 ENCOUNTER — Emergency Department (HOSPITAL_BASED_OUTPATIENT_CLINIC_OR_DEPARTMENT_OTHER): Payer: Managed Care, Other (non HMO)

## 2023-09-30 ENCOUNTER — Emergency Department (HOSPITAL_BASED_OUTPATIENT_CLINIC_OR_DEPARTMENT_OTHER)
Admission: EM | Admit: 2023-09-30 | Discharge: 2023-09-30 | Disposition: A | Discharge status: Home / Self Care | Payer: Managed Care, Other (non HMO) | Attending: Emergency Medicine | Admitting: Emergency Medicine

## 2023-09-30 ENCOUNTER — Encounter (HOSPITAL_BASED_OUTPATIENT_CLINIC_OR_DEPARTMENT_OTHER): Payer: Self-pay

## 2023-09-30 ENCOUNTER — Other Ambulatory Visit: Payer: Self-pay

## 2023-09-30 DIAGNOSIS — W19XXXA Unspecified fall, initial encounter: Secondary | ICD-10-CM | POA: Diagnosis not present

## 2023-09-30 DIAGNOSIS — M79605 Pain in left leg: Secondary | ICD-10-CM | POA: Insufficient documentation

## 2023-09-30 DIAGNOSIS — I809 Phlebitis and thrombophlebitis of unspecified site: Secondary | ICD-10-CM

## 2023-09-30 DIAGNOSIS — M25562 Pain in left knee: Secondary | ICD-10-CM | POA: Insufficient documentation

## 2023-09-30 NOTE — ED Triage Notes (Signed)
Pt states he fell last Thursday and hurt his left leg, today he is complaining of upper thigh pain and a knot.

## 2023-09-30 NOTE — ED Notes (Signed)

## 2023-09-30 NOTE — ED Notes (Signed)
Patient transported to X-ray 

## 2023-09-30 NOTE — ED Provider Notes (Addendum)
[facility] Provider Note   CSN: [id number] Arrival date & time: 09/30/23  1207     History  Chief Complaint  Patient presents with   Leg Pain    Fernando Martinez is a 42 y.o. male.  Patient status post fall a week ago.  Has some bruising to the left knee but was referred in by primary care doctor because he is got a lot of distal left anterior medial thigh discomfort.  Primary care doctor seem to be concerned about DVT.  Patient has full flexion and extension of the leg no numbness or weakness to the foot.  No calf tenderness.  No history of DVTs in the past.  Patient has not had x-rays of the knee.  Past medical history significant for sleep apnea plantar fasciitis ADHD diverticulitis.       Home Medications Prior to Admission medications   Medication Sig Start Date End Date Taking? Authorizing Provider  colchicine 0.6 MG tablet Take 1 tablet (0.6 mg total) by mouth daily. 01/25/18   Rodolph Bong, MD  diclofenac sodium (VOLTAREN) 1 % GEL Apply 4 g topically 4 (four) times daily. 01/24/18   Elson Areas, PA-C  Febuxostat (ULORIC) 80 MG TABS Take 1 tablet (80 mg total) by mouth daily. 01/26/18   Rodolph Bong, MD  metFORMIN (GLUCOPHAGE-XR) 500 MG 24 hr tablet Take 500 mg by mouth at bedtime. Take 2 at bedtime    [provider]  VYVANSE 30 MG capsule Take by mouth daily. 01/17/18   [provider]  VYVANSE 40 MG capsule Take by mouth daily. 01/17/18   [provider]      Allergies    Patient has no known allergies.    Review of Systems   Review of Systems  Constitutional:  Negative for chills and fever.  HENT:  Negative for ear pain and sore throat.   Eyes:  Negative for pain and visual disturbance.  Respiratory:  Negative for cough and shortness of breath.   Cardiovascular:  Negative for chest pain and palpitations.  Gastrointestinal:  Negative for abdominal pain and vomiting.  Genitourinary:   Negative for dysuria and hematuria.  Musculoskeletal:  Negative for arthralgias and back pain.  Skin:  Negative for color change and rash.  Neurological:  Negative for seizures and syncope.  All other systems reviewed and are negative.   Physical Exam Updated Vital Signs BP 126/89 (BP Location: Left Arm)   Pulse 90   Temp 98.1 F (36.7 C) (Oral)   Resp 19   SpO2 100%  Physical Exam Vitals and nursing note reviewed.  Constitutional:      General: He is not in acute distress.    Appearance: Normal appearance. He is well-developed.  HENT:     Head: Normocephalic and atraumatic.  Eyes:     Conjunctiva/sclera: Conjunctivae normal.  Cardiovascular:     Rate and Rhythm: Normal rate and regular rhythm.     Heart sounds: No murmur heard. Pulmonary:     Effort: Pulmonary effort is normal. No respiratory distress.     Breath sounds: Normal breath sounds.  Abdominal:     Palpations: Abdomen is soft.     Tenderness: There is no abdominal tenderness.  Musculoskeletal:        General: No swelling.     Cervical back: Neck supple.     Right lower leg: No edema.     Left lower leg: No edema.  Comments: Old bruising to the anterior left knee and distal aspect of the knee.  No significant leg swelling no calf tenderness.  Actually no tenderness to palpation to the distal anterior medial thigh.  No bruising in that area.  Maybe have some questionable fullness in that area.  Patient has full range of motion of the knee.  No evidence of any knee effusion.  Skin:    General: Skin is warm and dry.     Capillary Refill: Capillary refill takes less than 2 seconds.  Neurological:     General: No focal deficit present.     Mental Status: He is alert and oriented to person, place, and time.  Psychiatric:        Mood and Affect: Mood normal.     ED Results / Procedures / Treatments   Labs (all labs ordered are listed, but only abnormal results are displayed) Labs Reviewed - No data to  display  EKG None  Radiology No results found.  Procedures Procedures    Medications Ordered in ED Medications - No data to display  ED Course/ Medical Decision Making/ A&P                                 Medical Decision Making Amount and/or Complexity of Data Reviewed Radiology: ordered.   Patient with small bruising to the left knee no evidence of an effusion.  Full flexion and extension.  There is some fullness to the left distal anterior medial thigh area.  But not tender to palpation.  Not painful with extension of the knee.  Patient referred in by primary care provider for Doppler studies.  Will also get x-ray of the knee.  If both negative may refer to sports medicine for follow-up.  Patient's Doppler study does not show deep vein thrombosis.  But shows a collection of thrombophlebitis kind at the medial distal thigh area.  That would explain the area of pain and discomfort for him.  X-ray of the left knee has no bony abnormalities.  Based on this patient can be treated with a nonsteroidal.  Close follow-up with primary care doctor.   Final Clinical Impression(s) / ED Diagnoses Final diagnoses:  Left leg pain  Acute pain of left knee    Rx / DC Orders ED Discharge Orders     None         Vanetta Mulders, MD 09/30/23 1551    Vanetta Mulders, MD 09/30/23 1614    Vanetta Mulders, MD 09/30/23 1726

## 2023-09-30 NOTE — Discharge Instructions (Addendum)
No evidence of any deep vein thrombosis.  But there is evidence of some thrombophlebitis right where you are thigh is bothering you.  That some inflammation in superficial veins.  Usually treated with medicines like Motrin 800 mg every 8 hours or Aleve 500 mg every 12.  Would also recommend following back up with your primary care doctor.  Based on those findings do not need to give you a referral to sports medicine.  X-ray of the left knee also without any bony abnormalities.
